# Patient Record
Sex: Female | Born: 1960 | Race: Black or African American | Hispanic: No | Smoking: Current every day smoker
Health system: Southern US, Community
[De-identification: ages and names within clinical notes are randomized; demographics above are authoritative.]

## PROBLEM LIST (undated history)

## (undated) DIAGNOSIS — G4730 Sleep apnea, unspecified: Secondary | ICD-10-CM

## (undated) DIAGNOSIS — E119 Type 2 diabetes mellitus without complications: Secondary | ICD-10-CM

## (undated) DIAGNOSIS — E669 Obesity, unspecified: Secondary | ICD-10-CM

## (undated) DIAGNOSIS — R232 Flushing: Secondary | ICD-10-CM

## (undated) DIAGNOSIS — R4589 Other symptoms and signs involving emotional state: Secondary | ICD-10-CM

## (undated) DIAGNOSIS — I10 Essential (primary) hypertension: Secondary | ICD-10-CM

## (undated) HISTORY — DX: Other symptoms and signs involving emotional state: R45.89

## (undated) HISTORY — PX: OTHER SURGICAL HISTORY: SHX169

## (undated) HISTORY — PX: ROTATOR CUFF REPAIR: SHX139

## (undated) HISTORY — PX: CHOLECYSTECTOMY: SHX55

## (undated) HISTORY — DX: Flushing: R23.2

## (undated) HISTORY — PX: CARPAL TUNNEL RELEASE: SHX101

## (undated) HISTORY — PX: ENDOMETRIAL ABLATION: SHX621

## (undated) HISTORY — DX: Sleep apnea, unspecified: G47.30

## (undated) HISTORY — PX: TUBAL LIGATION: SHX77

## (undated) HISTORY — DX: Obesity, unspecified: E66.9

---

## 2009-12-14 ENCOUNTER — Encounter: Payer: Self-pay | Admitting: Orthopedic Surgery

## 2010-01-09 ENCOUNTER — Encounter: Payer: Self-pay | Admitting: Orthopedic Surgery

## 2010-05-07 ENCOUNTER — Emergency Department (HOSPITAL_COMMUNITY)
Admission: EM | Admit: 2010-05-07 | Discharge: 2010-05-07 | Disposition: A | Discharge status: Home / Self Care | Payer: Medicaid Other | Attending: Emergency Medicine | Admitting: Emergency Medicine

## 2010-05-07 DIAGNOSIS — I10 Essential (primary) hypertension: Secondary | ICD-10-CM | POA: Insufficient documentation

## 2010-05-07 DIAGNOSIS — E119 Type 2 diabetes mellitus without complications: Secondary | ICD-10-CM | POA: Insufficient documentation

## 2010-05-07 DIAGNOSIS — M545 Low back pain, unspecified: Secondary | ICD-10-CM | POA: Insufficient documentation

## 2010-05-07 LAB — URINALYSIS, ROUTINE W REFLEX MICROSCOPIC
Bilirubin Urine: NEGATIVE
Glucose, UA: 100 mg/dL — AB
Ketones, ur: NEGATIVE mg/dL
pH: 7 (ref 5.0–8.0)

## 2011-02-24 ENCOUNTER — Other Ambulatory Visit (HOSPITAL_COMMUNITY)
Admission: RE | Admit: 2011-02-24 | Discharge: 2011-02-24 | Disposition: A | Discharge status: Home / Self Care | Payer: Medicaid Other | Source: Ambulatory Visit | Attending: Obstetrics and Gynecology | Admitting: Obstetrics and Gynecology

## 2011-02-24 DIAGNOSIS — Z01419 Encounter for gynecological examination (general) (routine) without abnormal findings: Secondary | ICD-10-CM | POA: Insufficient documentation

## 2012-02-18 ENCOUNTER — Encounter (HOSPITAL_COMMUNITY): Payer: Self-pay

## 2012-02-18 ENCOUNTER — Emergency Department (HOSPITAL_COMMUNITY)
Admission: EM | Admit: 2012-02-18 | Discharge: 2012-02-18 | Disposition: A | Discharge status: Home / Self Care | Payer: BC Managed Care – PPO | Attending: Emergency Medicine | Admitting: Emergency Medicine

## 2012-02-18 ENCOUNTER — Emergency Department (HOSPITAL_COMMUNITY): Payer: BC Managed Care – PPO

## 2012-02-18 DIAGNOSIS — Z79899 Other long term (current) drug therapy: Secondary | ICD-10-CM | POA: Insufficient documentation

## 2012-02-18 DIAGNOSIS — W010XXA Fall on same level from slipping, tripping and stumbling without subsequent striking against object, initial encounter: Secondary | ICD-10-CM | POA: Insufficient documentation

## 2012-02-18 DIAGNOSIS — S8990XA Unspecified injury of unspecified lower leg, initial encounter: Secondary | ICD-10-CM | POA: Insufficient documentation

## 2012-02-18 DIAGNOSIS — I10 Essential (primary) hypertension: Secondary | ICD-10-CM | POA: Insufficient documentation

## 2012-02-18 DIAGNOSIS — R269 Unspecified abnormalities of gait and mobility: Secondary | ICD-10-CM | POA: Insufficient documentation

## 2012-02-18 DIAGNOSIS — M549 Dorsalgia, unspecified: Secondary | ICD-10-CM | POA: Insufficient documentation

## 2012-02-18 DIAGNOSIS — Y9301 Activity, walking, marching and hiking: Secondary | ICD-10-CM | POA: Insufficient documentation

## 2012-02-18 DIAGNOSIS — S99929A Unspecified injury of unspecified foot, initial encounter: Secondary | ICD-10-CM | POA: Insufficient documentation

## 2012-02-18 DIAGNOSIS — Y929 Unspecified place or not applicable: Secondary | ICD-10-CM | POA: Insufficient documentation

## 2012-02-18 DIAGNOSIS — E119 Type 2 diabetes mellitus without complications: Secondary | ICD-10-CM | POA: Insufficient documentation

## 2012-02-18 DIAGNOSIS — S8992XA Unspecified injury of left lower leg, initial encounter: Secondary | ICD-10-CM

## 2012-02-18 HISTORY — DX: Essential (primary) hypertension: I10

## 2012-02-18 HISTORY — DX: Type 2 diabetes mellitus without complications: E11.9

## 2012-02-18 MED ORDER — IBUPROFEN 600 MG PO TABS: 600.0000 mg | ORAL_TABLET | Freq: Four times a day (QID) | ORAL | Status: DC | PRN

## 2012-02-18 MED ORDER — OXYCODONE-ACETAMINOPHEN 5-325 MG PO TABS
1.0000 | ORAL_TABLET | ORAL | Status: DC | PRN
Start: 2012-02-18 — End: 2013-10-24

## 2012-02-18 NOTE — ED Provider Notes (Signed)
History     CSN: [id number]  Arrival date & time 02/18/12  1452   First MD Initiated Contact with Patient 02/18/12 1620      Chief Complaint  Patient presents with  . Knee Pain    HPI Pt is a 52 yo F with PMH of DM and HTN presenting 5 days s/p fall on wooden ramp. She states she slipped on slick surface and hit her left knee. She has no prior history of knee pain. She had immediate pain after fall and pain has not resolved or changed. She states her pain is her entire knee and it radiates down to her ankle. She states the joint felt warm earlier but no systemic fevers. She has an abrasion to her knee which she has treated with ice and neosporin. She is able to bear weight but it is uncomfortable. No ankle or hip pain. She endorses swelling of the leg but no new numbness or tingling. She has tried Aleve which does not help much.  Past Medical History  Diagnosis Date  . Hypertension   . Diabetes mellitus without complication     Past Surgical History  Procedure Laterality Date  . Carpal tunnel release    . Ablasion      cervical  . Cholecystectomy    . Rotator cuff repair      No family history on file.  History  Substance Use Topics  . Smoking status: Never Smoker   . Smokeless tobacco: Not on file  . Alcohol Use: No    OB History   Grav Para Term Preterm Abortions TAB SAB Ect Mult Living                  Review of Systems  Constitutional: Negative for fever and chills.  HENT: Negative for neck stiffness.   Eyes: Negative for visual disturbance.  Respiratory: Negative for shortness of breath.   Cardiovascular: Positive for leg swelling. Negative for chest pain.  Gastrointestinal: Negative for abdominal pain.  Genitourinary: Negative for difficulty urinating.  Musculoskeletal: Positive for myalgias, back pain, joint swelling, arthralgias and gait problem.  Skin: Positive for wound. Negative for rash.  All other systems reviewed and are negative.   Allergies   Codeine  Home Medications   Current Outpatient Rx  Name  Route  Sig  Dispense  Refill  . amLODipine (NORVASC) 10 MG tablet   Oral   Take 10 mg by mouth daily.         . insulin glargine (LANTUS SOLOSTAR) 100 UNIT/ML injection   Subcutaneous   Inject 71 Units into the skin at bedtime.         Marland Kitchen lisinopril (PRINIVIL,ZESTRIL) 40 MG tablet   Oral   Take 40 mg by mouth daily.         Marland Kitchen losartan (COZAAR) 100 MG tablet   Oral   Take 100 mg by mouth daily.         Marland Kitchen ibuprofen (ADVIL,MOTRIN) 600 MG tablet   Oral   Take 1 tablet (600 mg total) by mouth every 6 (six) hours as needed for pain.   30 tablet   0   . oxyCODONE-acetaminophen (PERCOCET/ROXICET) 5-325 MG per tablet   Oral   Take 1 tablet by mouth every 4 (four) hours as needed for pain.   10 tablet   0     BP 140/70  Pulse 82  Temp(Src) 97.8 F (36.6 C) (Oral)  Resp 18  Ht 5\' 5"  (1.651  m)  Wt 260 lb (117.935 kg)  BMI 43.27 kg/m2  SpO2 100%  Physical Exam  Constitutional: She is oriented to person, place, and time. She appears well-developed and well-nourished. No distress.  HENT:  Head: Normocephalic and atraumatic.  Neck: Normal range of motion.  Cardiovascular: Normal rate and regular rhythm.   No murmur heard. Pulmonary/Chest: Effort normal and breath sounds normal.  Abdominal: Soft. There is no tenderness.  Musculoskeletal:       Left knee: She exhibits decreased range of motion (Able to bend to 90 degrees), swelling, laceration (Well healing abrasion to patella without significant erythema) and bony tenderness. She exhibits no effusion. Tenderness (Diffuse tenderness, most prominent medial joint line and posterior knee) found.  Left lower leg with obvious edema compared to right. No calf pain, no redness.  Lymphadenopathy:    She has no cervical adenopathy.  Neurological: She is alert and oriented to person, place, and time. No cranial nerve deficit.  Skin: Skin is warm and dry. No rash noted.     ED Course  Procedures (including critical care time)  Labs Reviewed - No data to display Dg Knee Complete 4 Views Left  02/18/2012  *RADIOLOGY REPORT*  Clinical Data: Left knee pain, fell 5 days ago  LEFT KNEE - COMPLETE 4+ VIEW  Comparison: None.  Findings: The left knee joint spaces appear normal.  No fracture is seen.  No effusion is noted.  IMPRESSION: Negative.   Original Report Authenticated By: Dwyane Dee, M.D.    1. Left knee injury     MDM  52 yo F with left knee injury after fall  Patient seen and examined. Xrays reviewed. Given the swelling of her lower leg, she likely has some intraarticular injury. No risk factors for DVT (no immobilization, estrogen use, surgery, hypercoagulable state, calf tenderness or redness.) Will place in immobilizer and crutches to take weight off of knee. Given Percocet #10 and Ibuprofen 600mg . She will have an MRI scheduled as an outpatient for further joint evaluation. She will follow up with ortho after MRI. Patient agrees with this plan.      Hilarie Fredrickson, MD 02/18/12 [phone]

## 2012-02-18 NOTE — ED Provider Notes (Signed)
I saw and evaluated the patient, reviewed the resident's note and I agree with the findings and plan.   .Face to face Exam:  General:  Awake HEENT:  Atraumatic Resp:  Normal effort Abd:  Nondistended Neuro:No focal weakness Lymph: No adenopathy   Nelia Shi, MD 02/18/12 1736

## 2012-02-18 NOTE — ED Notes (Signed)
Pt reports slipping and falling last Tuesday, she injured left knee.

## 2012-05-16 ENCOUNTER — Ambulatory Visit (INDEPENDENT_AMBULATORY_CARE_PROVIDER_SITE_OTHER): Payer: BC Managed Care – PPO | Admitting: Orthopedic Surgery

## 2012-05-16 VITALS — BP 150/78 | Ht 65.0 in | Wt 268.0 lb

## 2012-05-16 DIAGNOSIS — IMO0002 Reserved for concepts with insufficient information to code with codable children: Secondary | ICD-10-CM

## 2012-05-16 DIAGNOSIS — M7050 Other bursitis of knee, unspecified knee: Secondary | ICD-10-CM

## 2012-05-16 DIAGNOSIS — M65831 Other synovitis and tenosynovitis, right forearm: Secondary | ICD-10-CM

## 2012-05-16 DIAGNOSIS — M65839 Other synovitis and tenosynovitis, unspecified forearm: Secondary | ICD-10-CM

## 2012-05-16 NOTE — Patient Instructions (Addendum)
Tendonitis and bursitis   Tendinitis Tendinitis is swelling and inflammation of the tendons. Tendons are band-like tissues that connect muscle to bone. Tendinitis commonly occurs in the:   Shoulders (rotator cuff).  Heels (Achilles tendon).  Elbows (triceps tendon). CAUSES Tendinitis is usually caused by overusing the tendon, muscles, and joints involved. When the tissue surrounding a tendon (synovium) becomes inflamed, it is called tenosynovitis. Tendinitis commonly develops in people whose jobs require repetitive motions. SYMPTOMS  Pain.  Tenderness.  Mild swelling. DIAGNOSIS Tendinitis is usually diagnosed by physical exam. Your caregiver may also order X-rays or other imaging tests. TREATMENT Your caregiver may recommend certain medicines or exercises for your treatment. HOME CARE INSTRUCTIONS   Use a sling or splint for as long as directed by your caregiver until the pain decreases.  Put ice on the injured area.  Put ice in a plastic bag.  Place a towel between your skin and the bag.  Leave the ice on for 15 to 20 minutes, 3 to 4 times a day.  Avoid using the limb while the tendon is painful. Perform gentle range of motion exercises only as directed by your caregiver. Stop exercises if pain or discomfort increase, unless directed otherwise by your caregiver.  Only take over-the-counter or prescription medicines for pain, discomfort, or fever as directed by your caregiver. SEEK MEDICAL CARE IF:   Your pain and swelling increase.  You develop new, unexplained symptoms, especially increased numbness in the hands. MAKE SURE YOU:   Understand these instructions.  Will watch your condition.  Will get help right away if you are not doing well or get worse. Document Released: 12/24/1999 Document Revised: 03/20/2011 Document Reviewed: 03/14/2010 [facility] Patient Information 2013 [place], [place]. Bursitis Bursitis is a swelling and soreness (inflammation) of a  fluid-filled sac (bursa) that overlies and protects a joint. It can be caused by injury, overuse of the joint, arthritis or infection. The joints most likely to be affected are the elbows, shoulders, hips and knees. HOME CARE INSTRUCTIONS   Apply ice to the affected area for 15 to 20 minutes each hour while awake for 2 days. Put the ice in a plastic bag and place a towel between the bag of ice and your skin.  Rest the injured joint as much as possible, but continue to put the joint through a full range of motion, 4 times per day. (The shoulder joint especially becomes rapidly "frozen" if not used.) When the pain lessens, begin normal slow movements and usual activities.  Only take over-the-counter or prescription medicines for pain, discomfort or fever as directed by your caregiver.  Your caregiver may recommend draining the bursa and injecting medicine into the bursa. This may help the healing process.  Follow all instructions for follow-up with your caregiver. This includes any orthopedic referrals, physical therapy and rehabilitation. Any delay in obtaining necessary care could result in a delay or failure of the bursitis to heal and chronic pain. SEEK IMMEDIATE MEDICAL CARE IF:   Your pain increases even during treatment.  You develop an oral temperature above 102 F (38.9 C) and have heat and inflammation over the involved bursa. MAKE SURE YOU:   Understand these instructions.  Will watch your condition.  Will get help right away if you are not doing well or get worse. Document Released: 12/24/1999 Document Revised: 03/20/2011 Document Reviewed: 11/27/2008 [facility] Patient Information 2013 [place], [place].   Wear splint x 4 weeks   Apply ice daily x 4 weeks   Continue  aleve

## 2012-05-16 NOTE — Progress Notes (Signed)
Patient ID: Bonnie Keith, female   DOB: 16-Sep-1960, 52 y.o.   MRN: [id number] Chief complaint #1 left knee pain  Chief complaint #2 right wrist pain  The patient started having right wrist pain about 3 days ago no trauma she does play a lot of video games. Complains of dorsal aching nonradiating wrist pain over the wrist joint associated with wrist extension  She injured the left knee with a fall about 3 months ago she did go to the ER x-rays were negative she complains of medial knee pain sharp throbbing 10 out of 10 constant morning and night sleeps with a pillow in between to her legs worse with standing and sitting associated with catching and swelling over the medial hamstring/pes tendons  14 systems reviewed negative except for seasonal allergies and musculoskeletal complaints  Allergies none  MEDICAL HISTORY:  Diabetes Hypertension  Surgeries:  History of carpal tunnel syndrome left and right surgery Rotator cuff tear repair right shoulder Cholecystectomy Tubal ligation  Family history arthritis, lung disease, cancer, diabetes  Social 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 Felecia Shelling  [facility] [place]  Vital signs BP 150/78  Ht 5\' 5"  (1.651 m)  Wt 268 lb (121.564 kg)  BMI 44.6 kg/m2   General appearance the patient is well-developed and well-nourished grooming and hygiene are normal body habitus large  The patient is alert and oriented x3 her mood and affect are normal  Ambulatory status normal gait   LUE: normal alignment, ROM stability and strength. RUE: shoulder normal elbow normal , tenderness and swelling right 2/3 extensor compartment with normal but pain ROM in wrist extension, watson test normal, wrist stability confirmed, alignment normal. No atrophy   Extremity right knee normal alignment, ROM, stability and Motor 5/5; skin normal  Left knee  Inspection tender over the medial pes  tendons Range of motion remains normal  The Lachman test is normal the anterior and posterior drawer tests are normal and the collateral ligaments are stable Motor exam 5/5 Skin normal  McMurray's sign negative    Cardiovascular exam normal pulse and perfusion without edema tenderness or varicose veins  Sensory exam is normal  Xrays left knee normal   DX 1 Pes bursitis 2 Intesection syndrome   Plan: Inject left knee bursa          Splint right wrist x 4 weeks   Knee  Injection Procedure Note  Pre-operative Diagnosis: left knee bursitis  Post-operative Diagnosis: same  Indications: pain  Anesthesia: ethyl chloride   Procedure Details   Verbal consent was obtained for the procedure. Time out was completed.The burs  was prepped with alcohol, followed by  Ethyl chloride spray and A 25 gauge needle was inserted into the pes bursa via lateral approach; 4ml 1% lidocaine and 1 ml of depomedrol  was then injected into the joint . The needle was removed and the area cleansed and dressed.  Complications:  None; patient tolerated the procedure well.

## 2012-05-17 ENCOUNTER — Encounter: Payer: Self-pay | Admitting: Orthopedic Surgery

## 2012-05-17 DIAGNOSIS — M65839 Other synovitis and tenosynovitis, unspecified forearm: Secondary | ICD-10-CM | POA: Insufficient documentation

## 2012-05-17 DIAGNOSIS — M7050 Other bursitis of knee, unspecified knee: Secondary | ICD-10-CM | POA: Insufficient documentation

## 2012-07-03 ENCOUNTER — Other Ambulatory Visit: Payer: Self-pay | Admitting: Adult Health

## 2012-07-03 DIAGNOSIS — Z139 Encounter for screening, unspecified: Secondary | ICD-10-CM

## 2012-07-26 ENCOUNTER — Ambulatory Visit (HOSPITAL_COMMUNITY)
Admission: RE | Admit: 2012-07-26 | Discharge: 2012-07-26 | Disposition: A | Discharge status: Home / Self Care | Payer: BC Managed Care – PPO | Source: Ambulatory Visit | Attending: Adult Health | Admitting: Adult Health

## 2012-07-26 DIAGNOSIS — Z139 Encounter for screening, unspecified: Secondary | ICD-10-CM

## 2012-07-26 DIAGNOSIS — Z1231 Encounter for screening mammogram for malignant neoplasm of breast: Secondary | ICD-10-CM | POA: Insufficient documentation

## 2012-09-04 ENCOUNTER — Other Ambulatory Visit: Payer: Self-pay | Admitting: Adult Health

## 2012-10-23 ENCOUNTER — Other Ambulatory Visit: Payer: Self-pay | Admitting: Adult Health

## 2013-09-17 ENCOUNTER — Other Ambulatory Visit: Payer: Self-pay | Admitting: Adult Health

## 2013-09-17 DIAGNOSIS — Z1231 Encounter for screening mammogram for malignant neoplasm of breast: Secondary | ICD-10-CM

## 2013-10-10 ENCOUNTER — Ambulatory Visit (HOSPITAL_COMMUNITY): Payer: BC Managed Care – PPO

## 2013-10-20 ENCOUNTER — Ambulatory Visit (HOSPITAL_COMMUNITY)
Admission: RE | Admit: 2013-10-20 | Discharge: 2013-10-20 | Disposition: A | Discharge status: Home / Self Care | Payer: PRIVATE HEALTH INSURANCE | Source: Ambulatory Visit | Attending: Adult Health | Admitting: Adult Health

## 2013-10-20 DIAGNOSIS — Z1231 Encounter for screening mammogram for malignant neoplasm of breast: Secondary | ICD-10-CM | POA: Diagnosis not present

## 2013-10-24 ENCOUNTER — Encounter: Payer: Self-pay | Admitting: Adult Health

## 2013-10-24 ENCOUNTER — Other Ambulatory Visit (HOSPITAL_COMMUNITY)
Admission: RE | Admit: 2013-10-24 | Discharge: 2013-10-24 | Disposition: A | Discharge status: Home / Self Care | Payer: PRIVATE HEALTH INSURANCE | Source: Ambulatory Visit | Attending: Adult Health | Admitting: Adult Health

## 2013-10-24 ENCOUNTER — Ambulatory Visit (INDEPENDENT_AMBULATORY_CARE_PROVIDER_SITE_OTHER): Payer: PRIVATE HEALTH INSURANCE | Admitting: Adult Health

## 2013-10-24 VITALS — BP 132/76 | HR 80 | Ht 65.0 in | Wt 262.0 lb

## 2013-10-24 DIAGNOSIS — R232 Flushing: Secondary | ICD-10-CM | POA: Insufficient documentation

## 2013-10-24 DIAGNOSIS — Z01419 Encounter for gynecological examination (general) (routine) without abnormal findings: Secondary | ICD-10-CM

## 2013-10-24 DIAGNOSIS — Z780 Asymptomatic menopausal state: Secondary | ICD-10-CM

## 2013-10-24 DIAGNOSIS — R4589 Other symptoms and signs involving emotional state: Secondary | ICD-10-CM | POA: Insufficient documentation

## 2013-10-24 DIAGNOSIS — Z1151 Encounter for screening for human papillomavirus (HPV): Secondary | ICD-10-CM | POA: Insufficient documentation

## 2013-10-24 DIAGNOSIS — Z1212 Encounter for screening for malignant neoplasm of rectum: Secondary | ICD-10-CM

## 2013-10-24 HISTORY — DX: Other symptoms and signs involving emotional state: R45.89

## 2013-10-24 HISTORY — DX: Flushing: R23.2

## 2013-10-24 LAB — HEMOCCULT GUIAC POC 1CARD (OFFICE): FECAL OCCULT BLD: NEGATIVE

## 2013-10-24 MED ORDER — VENLAFAXINE HCL ER 37.5 MG PO CP24: 37.5000 mg | ORAL_CAPSULE | Freq: Every day | ORAL | Status: DC

## 2013-10-24 MED ORDER — VENLAFAXINE HCL ER 75 MG PO CP24: 75.0000 mg | ORAL_CAPSULE | Freq: Every day | ORAL | Status: DC

## 2013-10-24 MED ORDER — ALPRAZOLAM 0.5 MG PO TABS: 0.5000 mg | ORAL_TABLET | Freq: Two times a day (BID) | ORAL | Status: DC | PRN

## 2013-10-24 NOTE — Progress Notes (Signed)
Patient ID: Bonnie Keith, female   DOB: 01/09/1961, 53 y.o.   MRN: [id number] History of Present Illness: Bonnie Keith is a [age] year old black female in for a pap and physical.She complains of being moody ,stressed at work, hot flashes and not sleeping well.   Current Medications, Allergies, Past Medical History, Past Surgical History, Family History and Social History were reviewed in [facility] electronic medical record.     Review of Systems: Patient denies any headaches, blurred vision, shortness of breath, chest pain, abdominal pain, problems with bowel movements, urination, or intercourse. Not having sex, has pain in shoulder, she is tense, see HPI.She is diabetic and has hypertension and is off diabetes meds, has appt to see [facility] today. Got flu shot at work.  Physical Exam:BP 132/76  Pulse 80  Ht 5\' 5"  (1.651 m)  Wt 262 lb (118.842 kg)  BMI 43.60 kg/m2 General:  Well developed, well nourished, no acute distress Skin:  Warm and dry Neck:  Midline trachea, normal thyroid Lungs; Clear to auscultation bilaterally Breast:  No dominant palpable mass, retraction, or nipple discharge, has small tear under left breast,use neosporin. Cardiovascular: Regular rate and rhythm Abdomen:  Soft, non tender, no hepatosplenomegaly,obese Pelvic:  External genitalia is normal in appearance.  The vagina is normal in appearance.  The cervix is smooth and stenotic at os, pap with HPV performed.Marland Kitchen.  Uterus is felt to be normal size, shape, and contour.  No  adnexal masses or tenderness noted.Diffficult secondary to abdominal girth. Rectal: Good sphincter tone, no polyps,internal  hemorrhoids felt.  Hemoccult negative. Extremities:  No swelling or varicosities noted Psych: Alert and cooperative,seems happy but stressed Discussed trying med for moods and hot flashes and will try Effexor.  Impression: Well woman yearly exam with pap Hot flashes Moody  Menopause     Plan:  Physical in  1 year Mammogram yearly  Rx Effexor XR 37.5 mg #7 1 daily x 7 days then increase to 75 mg and rx given with 11 refills  Rx xanax 0.5 mg #30 1 bid #30 with 1 refill Follow up in 4 weeks Review handout on menopause

## 2013-10-24 NOTE — Patient Instructions (Addendum)
Menopause Menopause is the normal time of life when menstrual periods stop completely. Menopause is complete when you have missed 12 consecutive menstrual periods. It usually occurs between the ages of 48 years and 55 years. Very rarely does a woman develop menopause before the age of 40 years. At menopause, your ovaries stop producing the female hormones estrogen and progesterone. This can cause undesirable symptoms and also affect your health. Sometimes the symptoms may occur 4-5 years before the menopause begins. There is no relationship between menopause and:  Oral contraceptives.  Number of children you had.  Race.  The age your menstrual periods started (menarche). Heavy smokers and very thin women may develop menopause earlier in life. CAUSES  The ovaries stop producing the female hormones estrogen and progesterone.  Other causes include:  Surgery to remove both ovaries.  The ovaries stop functioning for no known reason.  Tumors of the pituitary gland in the brain.  Medical disease that affects the ovaries and hormone production.  Radiation treatment to the abdomen or pelvis.  Chemotherapy that affects the ovaries. SYMPTOMS   Hot flashes.  Night sweats.  Decrease in sex drive.  Vaginal dryness and thinning of the vagina causing painful intercourse.  Dryness of the skin and developing wrinkles.  Headaches.  Tiredness.  Irritability.  Memory problems.  Weight gain.  Bladder infections.  Hair growth of the face and chest.  Infertility. More serious symptoms include:  Loss of bone (osteoporosis) causing breaks (fractures).  Depression.  Hardening and narrowing of the arteries (atherosclerosis) causing heart attacks and strokes. DIAGNOSIS   When the menstrual periods have stopped for 12 straight months.  Physical exam.  Hormone studies of the blood. TREATMENT  There are many treatment choices and nearly as many questions about them. The  decisions to treat or not to treat menopausal changes is an individual choice made with your health care provider. Your health care provider can discuss the treatments with you. Together, you can decide which treatment will work best for you. Your treatment choices may include:   Hormone therapy (estrogen and progesterone).  Non-hormonal medicines.  Treating the individual symptoms with medicine (for example antidepressants for depression).  Herbal medicines that may help specific symptoms.  Counseling by a psychiatrist or psychologist.  Group therapy.  Lifestyle changes including:  Eating healthy.  Regular exercise.  Limiting caffeine and alcohol.  Stress management and meditation.  No treatment. HOME CARE INSTRUCTIONS   Take the medicine your health care provider gives you as directed.  Get plenty of sleep and rest.  Exercise regularly.  Eat a diet that contains calcium (good for the bones) and soy products (acts like estrogen hormone).  Avoid alcoholic beverages.  Do not smoke.  If you have hot flashes, dress in layers.  Take supplements, calcium, and vitamin D to strengthen bones.  You can use over-the-counter lubricants or moisturizers for vaginal dryness.  Group therapy is sometimes very helpful.  Acupuncture may be helpful in some cases. SEEK MEDICAL CARE IF:   You are not sure you are in menopause.  You are having menopausal symptoms and need advice and treatment.  You are still having menstrual periods after age 55 years.  You have pain with intercourse.  Menopause is complete (no menstrual period for 12 months) and you develop vaginal bleeding.  You need a referral to a specialist (gynecologist, psychiatrist, or psychologist) for treatment. SEEK IMMEDIATE MEDICAL CARE IF:   You have severe depression.  You have excessive vaginal bleeding.    You fell and think you have a broken bone.  You have pain when you urinate.  You develop leg or  chest pain.  You have a fast pounding heart beat (palpitations).  You have severe headaches.  You develop vision problems.  You feel a lump in your breast.  You have abdominal pain or severe indigestion. Document Released: 03/18/2003 Document Revised: 08/28/2012 Document Reviewed: 07/25/2012 [facility] Patient Information 2015 [place]. This information is not intended to replace advice given to you by your health care provider. Make sure you discuss any questions you have with your health care provider. Take effexor  follow u pin 4 weeks

## 2013-10-28 LAB — CYTOLOGY - PAP

## 2013-11-10 ENCOUNTER — Encounter: Payer: Self-pay | Admitting: Adult Health

## 2013-11-21 ENCOUNTER — Ambulatory Visit (INDEPENDENT_AMBULATORY_CARE_PROVIDER_SITE_OTHER): Payer: PRIVATE HEALTH INSURANCE | Admitting: Adult Health

## 2013-11-21 ENCOUNTER — Encounter: Payer: Self-pay | Admitting: Adult Health

## 2013-11-21 VITALS — BP 136/70 | Ht 65.0 in | Wt 259.0 lb

## 2013-11-21 DIAGNOSIS — R232 Flushing: Secondary | ICD-10-CM

## 2013-11-21 DIAGNOSIS — R4589 Other symptoms and signs involving emotional state: Secondary | ICD-10-CM

## 2013-11-21 DIAGNOSIS — F489 Nonpsychotic mental disorder, unspecified: Secondary | ICD-10-CM

## 2013-11-21 DIAGNOSIS — N951 Menopausal and female climacteric states: Secondary | ICD-10-CM

## 2013-11-21 MED ORDER — ALPRAZOLAM 0.5 MG PO TABS: 0.5000 mg | ORAL_TABLET | Freq: Two times a day (BID) | ORAL | Status: DC | PRN

## 2013-11-21 MED ORDER — VENLAFAXINE HCL ER 150 MG PO CP24: 150.0000 mg | ORAL_CAPSULE | Freq: Every day | ORAL | Status: DC

## 2013-11-21 NOTE — Patient Instructions (Signed)
Will increase effexor to 150 mg and follow up in 5 weeks

## 2013-11-21 NOTE — Progress Notes (Signed)
Subjective:     Patient ID: Bonnie Keith, female   DOB: 08/24/1960, 53 y.o.   MRN: [id number]  HPI Bonnie Keith is a 53 year old black female back in follow up of starting Effexor.  Review of Systems See HPI Reviewed past medical,surgical, social and family history. Reviewed medications and allergies.     Objective:   Physical Exam BP 136/70 mmHg  Ht 5\' 5"  (1.651 m)  Wt 259 lb (117.482 kg)  BMI 43.10 kg/m2   She says hot flashes better, but still moody and stressed at work and not sleeping well unless takes a xanax. Discussed increasing Effexor and she agrees.She says she used to take Paxil and another med but she weaned herself off.She says her DON says she needs to take a happy pill.  Assessment:     Moody Hot flashes    Plan:    Refilled xanax 0.5 mg #30 1 bid prn with 1 refill Rx Effexor XR 150 mg #30 take 1 daily with 3 refills(OK to take 2 75 mg tabs,has at home) Follow up in 5 weeks

## 2013-12-26 ENCOUNTER — Ambulatory Visit: Payer: PRIVATE HEALTH INSURANCE | Admitting: Adult Health

## 2014-01-17 ENCOUNTER — Emergency Department (HOSPITAL_COMMUNITY)
Admission: EM | Admit: 2014-01-17 | Discharge: 2014-01-17 | Disposition: A | Discharge status: Home / Self Care | Payer: PRIVATE HEALTH INSURANCE | Attending: Emergency Medicine | Admitting: Emergency Medicine

## 2014-01-17 ENCOUNTER — Encounter (HOSPITAL_COMMUNITY): Payer: Self-pay | Admitting: Emergency Medicine

## 2014-01-17 DIAGNOSIS — E119 Type 2 diabetes mellitus without complications: Secondary | ICD-10-CM | POA: Insufficient documentation

## 2014-01-17 DIAGNOSIS — X58XXXA Exposure to other specified factors, initial encounter: Secondary | ICD-10-CM | POA: Insufficient documentation

## 2014-01-17 DIAGNOSIS — Y9289 Other specified places as the place of occurrence of the external cause: Secondary | ICD-10-CM | POA: Insufficient documentation

## 2014-01-17 DIAGNOSIS — Y9389 Activity, other specified: Secondary | ICD-10-CM | POA: Insufficient documentation

## 2014-01-17 DIAGNOSIS — Y998 Other external cause status: Secondary | ICD-10-CM | POA: Insufficient documentation

## 2014-01-17 DIAGNOSIS — Z8742 Personal history of other diseases of the female genital tract: Secondary | ICD-10-CM | POA: Insufficient documentation

## 2014-01-17 DIAGNOSIS — Z792 Long term (current) use of antibiotics: Secondary | ICD-10-CM | POA: Insufficient documentation

## 2014-01-17 DIAGNOSIS — E669 Obesity, unspecified: Secondary | ICD-10-CM | POA: Insufficient documentation

## 2014-01-17 DIAGNOSIS — S233XXA Sprain of ligaments of thoracic spine, initial encounter: Secondary | ICD-10-CM

## 2014-01-17 DIAGNOSIS — S29012A Strain of muscle and tendon of back wall of thorax, initial encounter: Secondary | ICD-10-CM | POA: Insufficient documentation

## 2014-01-17 DIAGNOSIS — Z87891 Personal history of nicotine dependence: Secondary | ICD-10-CM | POA: Insufficient documentation

## 2014-01-17 DIAGNOSIS — Z8659 Personal history of other mental and behavioral disorders: Secondary | ICD-10-CM | POA: Insufficient documentation

## 2014-01-17 DIAGNOSIS — Z79899 Other long term (current) drug therapy: Secondary | ICD-10-CM | POA: Insufficient documentation

## 2014-01-17 DIAGNOSIS — I10 Essential (primary) hypertension: Secondary | ICD-10-CM | POA: Insufficient documentation

## 2014-01-17 MED ORDER — METHOCARBAMOL 500 MG PO TABS: 500.0000 mg | ORAL_TABLET | Freq: Two times a day (BID) | ORAL | Status: DC

## 2014-01-17 NOTE — Discharge Instructions (Signed)
Back Pain, Adult °Back pain is very common. The pain often gets better over time. The cause of back pain is usually not dangerous. Most people can learn to manage their back pain on their own.  °HOME CARE  °· Stay active. Start with short walks on flat ground if you can. Try to walk farther each day. °· Do not sit, drive, or stand in one place for more than 30 minutes. Do not stay in bed. °· Do not avoid exercise or work. Activity can help your back heal faster. °· Be careful when you bend or lift an object. Bend at your knees, keep the object close to you, and do not twist. °· Sleep on a firm mattress. Lie on your side, and bend your knees. If you lie on your back, put a pillow under your knees. °· Only take medicines as told by your doctor. °· Put ice on the injured area. °¨ Put ice in a plastic bag. °¨ Place a towel between your skin and the bag. °¨ Leave the ice on for 15-20 minutes, 03-04 times a day for the first 2 to 3 days. After that, you can switch between ice and heat packs. °· Ask your doctor about back exercises or massage. °· Avoid feeling anxious or stressed. Find good ways to deal with stress, such as exercise. °GET HELP RIGHT AWAY IF:  °· Your pain does not go away with rest or medicine. °· Your pain does not go away in 1 week. °· You have new problems. °· You do not feel well. °· The pain spreads into your legs. °· You cannot control when you poop (bowel movement) or pee (urinate). °· Your arms or legs feel weak or lose feeling (numbness). °· You feel sick to your stomach (nauseous) or throw up (vomit). °· You have belly (abdominal) pain. °· You feel like you may pass out (faint). °MAKE SURE YOU:  °· Understand these instructions. °· Will watch your condition. °· Will get help right away if you are not doing well or get worse. °Document Released: 06/14/2007 Document Revised: 03/20/2011 Document Reviewed: 04/29/2013 °[employer]® Patient Information ©2015 [employer]. This information is not intended  to replace advice given to you by your health care provider. Make sure you discuss any questions you have with your health care provider. ° °

## 2014-01-17 NOTE — ED Provider Notes (Signed)
CSN: [id number]     Arrival date & time 01/17/14  1238 History   First MD Initiated Contact with Patient 01/17/14 1331     Chief Complaint  Patient presents with  . Back Pain     (Consider location/radiation/quality/duration/timing/severity/associated sxs/prior Treatment) Patient is a 54 y.o. female presenting with back pain. The history is provided by the patient.  Back Pain Location:  Thoracic spine Radiates to:  Does not radiate Pain severity:  Moderate Onset quality:  Gradual Duration:  3 days Timing:  Constant Progression:  Worsening Relieved by:  Nothing Worsened by:  Movement, bending and ambulation Associated symptoms: no bladder incontinence and no bowel incontinence    Bonnie Keith is a 54 y.o. female who presents to the ED with low back pain that started 3 days ago. She does not know of any injury to her back. She denies loss of control of bladder or bowels.  Past Medical History  Diagnosis Date  . Hypertension   . Diabetes mellitus without complication   . Obesity   . Hot flashes 10/24/2013  . Moody 10/24/2013   Past Surgical History  Procedure Laterality Date  . Carpal tunnel release    . Ablasion      cervical  . Cholecystectomy    . Rotator cuff repair    . Tubal ligation     Family History  Problem Relation Age of Onset  . Cancer Mother     lung  . Diabetes Mother   . COPD Mother   . Stroke Father   . Hypertension Sister   . Irritable bowel syndrome Sister   . Hypertension Maternal Grandmother   . Diabetes Maternal Grandmother   . Hypertension Maternal Grandfather   . Diabetes Maternal Grandfather   . Cancer Maternal Grandfather     black lung disease  . Stroke Paternal Grandfather   . Irritable bowel syndrome Sister    History  Substance Use Topics  . Smoking status: Former Smoker    Types: Cigarettes  . Smokeless tobacco: Never Used  . Alcohol Use: No   OB History    Gravida Para Term Preterm AB TAB SAB Ectopic Multiple Living   2  1   1  1   1      Review of Systems  Gastrointestinal: Negative for bowel incontinence.  Genitourinary: Negative for bladder incontinence.  Musculoskeletal: Positive for back pain.  all other systems negative    Allergies  Codeine and Lisinopril  Home Medications   Prior to Admission medications   Medication Sig Start Date End Date Taking? Authorizing Provider  amLODipine (NORVASC) 10 MG tablet Take 5 mg by mouth daily.    Yes Historical Provider, MD  empagliflozin (JARDIANCE) 25 MG TABS tablet Take 25 mg by mouth daily.   Yes Historical Provider, MD  glipiZIDE (GLUCOTROL) 5 MG tablet Take 5 mg by mouth daily.   Yes Historical Provider, MD  losartan (COZAAR) 100 MG tablet Take 100 mg by mouth daily.   Yes Historical Provider, MD  metroNIDAZOLE (METROCREAM) 0.75 % cream Apply 1 application topically 2 (two) times daily.   Yes Historical Provider, MD  ALPRAZolam Prudy Feeler(XANAX) 0.5 MG tablet Take 1 tablet (0.5 mg total) by mouth 2 (two) times daily as needed for anxiety. Patient not taking: Reported on 01/17/2014 11/21/13   Adline PotterJennifer A Griffin, NP  ibuprofen (ADVIL,MOTRIN) 600 MG tablet Take 1 tablet (600 mg total) by mouth every 6 (six) hours as needed for pain. Patient not taking: Reported on  01/17/2014 02/18/12   Amber Nydia Bouton, MD  methocarbamol (ROBAXIN) 500 MG tablet Take 1 tablet (500 mg total) by mouth 2 (two) times daily. 01/17/14   Tanice Petre Orlene Och, NP  venlafaxine XR (EFFEXOR-XR) 150 MG 24 hr capsule Take 1 capsule (150 mg total) by mouth daily with breakfast. Patient not taking: Reported on 01/17/2014 11/21/13   Adline Potter, NP   BP 154/63 mmHg  Pulse 71  Temp(Src) 97.5 F (36.4 C) (Core (Comment))  Resp 18  SpO2 100% Physical Exam  Constitutional: She is oriented to person, place, and time. She appears well-developed and well-nourished. No distress.  HENT:  Head: Normocephalic and atraumatic.  Right Ear: Tympanic membrane normal.  Left Ear: Tympanic membrane normal.  Nose: Nose  normal.  Mouth/Throat: Uvula is midline, oropharynx is clear and moist and mucous membranes are normal.  Eyes: EOM are normal.  Neck: Normal range of motion. Neck supple.  Cardiovascular: Normal rate and regular rhythm.   Pulmonary/Chest: Effort normal. She has no wheezes. She has no rales.  Abdominal: Soft. Bowel sounds are normal. There is no tenderness.  Musculoskeletal:       Thoracic back: She exhibits tenderness and spasm. She exhibits normal range of motion and normal pulse.       Back:  Neurological: She is alert and oriented to person, place, and time. She has normal strength. No cranial nerve deficit or sensory deficit. Gait normal.  Reflex Scores:      Bicep reflexes are 2+ on the right side and 2+ on the left side.      Brachioradialis reflexes are 2+ on the right side and 2+ on the left side.      Patellar reflexes are 2+ on the right side and 2+ on the left side.      Achilles reflexes are 2+ on the right side and 2+ on the left side. Skin: Skin is warm and dry.  Psychiatric: She has a normal mood and affect. Her behavior is normal.  Nursing note and vitals reviewed.   ED Course  Procedures (including critical care time) Labs Review  MDM  54 y.o. female with thoracic pain x 3 days. Stable for discharge without neuro deficits. Will treat with muscle relaxants and she will take ibuprofen. She will use heat but only on low so to prevent burns. Discussed with the patient and all questioned fully answered. She will return if any problems arise.   Final diagnoses:  Thoracic sprain and strain, initial encounter     [facility], NP 01/18/14 1621  Hilario Quarry, MD 01/21/14 [phone]

## 2014-01-17 NOTE — ED Notes (Signed)
PT c/o lower back pain x3 days with no reported injury. PT denies any urinary symptoms. PT ambulatory in triage with NAD noted.

## 2014-02-26 LAB — HEMOGLOBIN A1C: Hemoglobin A1C: 9.5

## 2014-09-28 ENCOUNTER — Ambulatory Visit: Payer: PRIVATE HEALTH INSURANCE | Admitting: Adult Health

## 2014-11-24 ENCOUNTER — Other Ambulatory Visit: Payer: Self-pay | Admitting: Adult Health

## 2014-11-24 DIAGNOSIS — Z1231 Encounter for screening mammogram for malignant neoplasm of breast: Secondary | ICD-10-CM

## 2014-12-07 ENCOUNTER — Ambulatory Visit (HOSPITAL_COMMUNITY): Payer: PRIVATE HEALTH INSURANCE

## 2015-01-08 ENCOUNTER — Ambulatory Visit (HOSPITAL_COMMUNITY): Payer: PRIVATE HEALTH INSURANCE

## 2015-01-13 ENCOUNTER — Other Ambulatory Visit: Payer: Self-pay | Admitting: Adult Health

## 2015-01-15 ENCOUNTER — Ambulatory Visit (HOSPITAL_COMMUNITY)
Admission: RE | Admit: 2015-01-15 | Discharge: 2015-01-15 | Disposition: A | Discharge status: Home / Self Care | Payer: 59 | Source: Ambulatory Visit | Attending: Adult Health | Admitting: Adult Health

## 2015-01-15 DIAGNOSIS — Z1231 Encounter for screening mammogram for malignant neoplasm of breast: Secondary | ICD-10-CM

## 2015-01-15 LAB — HEMOGLOBIN A1C: HEMOGLOBIN A1C: 8.9

## 2015-03-05 ENCOUNTER — Other Ambulatory Visit: Payer: Self-pay | Admitting: Adult Health

## 2015-03-05 LAB — LIPID PANEL
Cholesterol: 113 mg/dL (ref 0–200)
HDL: 43 mg/dL (ref 35–70)
LDL CALC: 47 mg/dL
TRIGLYCERIDES: 115 mg/dL (ref 40–160)

## 2015-03-12 ENCOUNTER — Other Ambulatory Visit: Payer: Self-pay | Admitting: Adult Health

## 2015-03-17 ENCOUNTER — Ambulatory Visit: Payer: PRIVATE HEALTH INSURANCE | Admitting: Adult Health

## 2015-04-09 ENCOUNTER — Ambulatory Visit: Payer: PRIVATE HEALTH INSURANCE | Admitting: Adult Health

## 2015-04-09 ENCOUNTER — Other Ambulatory Visit: Payer: Self-pay | Admitting: Adult Health

## 2015-06-18 LAB — HEMOGLOBIN A1C: HEMOGLOBIN A1C: 11.7

## 2015-06-22 ENCOUNTER — Ambulatory Visit (INDEPENDENT_AMBULATORY_CARE_PROVIDER_SITE_OTHER): Payer: 59 | Admitting: Pulmonary Disease

## 2015-06-22 ENCOUNTER — Encounter: Payer: Self-pay | Admitting: Pulmonary Disease

## 2015-06-22 VITALS — BP 136/86 | HR 85 | Ht 65.0 in | Wt 265.8 lb

## 2015-06-22 DIAGNOSIS — G4733 Obstructive sleep apnea (adult) (pediatric): Secondary | ICD-10-CM | POA: Diagnosis not present

## 2015-06-22 DIAGNOSIS — E662 Morbid (severe) obesity with alveolar hypoventilation: Secondary | ICD-10-CM

## 2015-06-22 DIAGNOSIS — J9611 Chronic respiratory failure with hypoxia: Secondary | ICD-10-CM | POA: Diagnosis not present

## 2015-06-22 DIAGNOSIS — Z6841 Body Mass Index (BMI) 40.0 and over, adult: Secondary | ICD-10-CM

## 2015-06-22 NOTE — Patient Instructions (Signed)
Will arrange for sleep study Will call to arrange for follow up after sleep study reviewed 

## 2015-06-22 NOTE — Progress Notes (Signed)
Past Surgical History She  has past surgical history that includes Carpal tunnel release; ablasion; Cholecystectomy; Rotator cuff repair; and Tubal ligation.  Allergies  Allergen Reactions  . Codeine Itching  . Lisinopril Cough    Family History Her family history includes COPD in her mother; Cancer in her maternal grandfather and mother; Diabetes in her maternal grandfather, maternal grandmother, and mother; Hypertension in her maternal grandfather, maternal grandmother, and sister; Irritable bowel syndrome in her sister and sister; Stroke in her father and paternal grandfather.  Social History She  reports that she quit smoking about 4 years ago. Her smoking use included Cigarettes. She has a 15 pack-year smoking history. She has never used smokeless tobacco. She reports that she does not drink alcohol or use illicit drugs.  Review of systems C/o headaches.  Current Outpatient Prescriptions on File Prior to Visit  Medication Sig  . amLODipine (NORVASC) 10 MG tablet Take 5 mg by mouth daily.   Marland Kitchen glipiZIDE (GLUCOTROL) 5 MG tablet Take 10 mg by mouth 2 (two) times daily.   Marland Kitchen ibuprofen (ADVIL,MOTRIN) 600 MG tablet Take 1 tablet (600 mg total) by mouth every 6 (six) hours as needed for pain.  Marland Kitchen losartan (COZAAR) 100 MG tablet Take 100 mg by mouth daily.  . metroNIDAZOLE (METROCREAM) 0.75 % cream Apply 1 application topically 2 (two) times daily.   No current facility-administered medications on file prior to visit.    Chief Complaint  Patient presents with  . SLEEP CONSULT    Referred by Dr Sherril Croon. Sleep Study in [place], [place]. Current CPAP- not using every night d/t work. Uses 2L O2 at night. DME: [facility] [place]  Epworth Score: 0    Tests:  Past medical history She  has a past medical history of Hypertension; Diabetes mellitus without complication (HCC); Obesity; Hot flashes (10/24/2013); Moody (10/24/2013); and Sleep apnea.  Vital signs BP 136/86 mmHg  Pulse 85  Ht   (1.651 m)  Wt 265 lb 12.8 oz (120.566 kg)  BMI 44.23 kg/m2  SpO2 96%  History of Present Illness Bonnie Keith is a 55 y.o. female for evaluation of sleep problems.  She had sleep study about 10 yrs ago.  She has been on CPAP and 2 liters oxygen at night.  She doesn't keep up with her supplies.  Her machine is several years old.  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  She goes to sleep at 11 pm.  She falls asleep after 20 minutes.  She wakes up several times to use the bathroom.  She gets out of bed at 430 am.  She feels tired in the morning.  She denies morning headache.  She does not use anything to help her fall sleep or stay awake.  She denies sleep walking, sleep talking, bruxism, or nightmares.  There is no history of restless legs.  She denies sleep hallucinations, sleep paralysis, or cataplexy.  The Epworth score is 0 out of 24.   Physical Exam:  General - No distress ENT - No sinus tenderness, no oral exudate, no LAN, no thyromegaly, TM clear, pupils equal/reactive, MP 3 Cardiac - s1s2 regular, no murmur, pulses symmetric Chest - No wheeze/rales/dullness, good air entry, normal respiratory excursion Back - No focal tenderness Abd - Soft, non-tender, no organomegaly, + bowel sounds Ext - ankle edema Neuro - Normal strength, cranial nerves intact  Skin - No rashes Psych - Normal mood, and behavior  Discussion: She has hx of sleep apnea.  She has been on CPAP and supplemental oxygen.  She has gained weight since original CPAP set up.  She likely also has sleep related hypoxia/hypoventilation.  Assessment/plan:  Obstructive sleep apnea. - will arrange for repeat in lab sleep study  Chronic respiratory failure with hypoxia likely from obesity hypoventilation syndrome. - will determine if she can use BiPAP and/or continue supplemental oxygen after review of her sleep  study  Obesity. - discussed importance of weight loss   Patient Instructions  Will arrange for sleep study Will call to arrange for follow up after sleep study reviewed      Coralyn HellingVineet Tayona Sarnowski, M.D. Pager [phone] 06/22/2015, 2:45 PM

## 2015-07-16 ENCOUNTER — Telehealth: Payer: Self-pay | Admitting: Pulmonary Disease

## 2015-07-16 NOTE — Telephone Encounter (Signed)
Error.Stanley A Dalton ° °

## 2015-07-16 NOTE — Telephone Encounter (Signed)
Caller changed mine error.Caren GriffinsStanley A Dalton

## 2015-07-19 ENCOUNTER — Telehealth: Payer: Self-pay | Admitting: Pulmonary Disease

## 2015-07-19 DIAGNOSIS — G4733 Obstructive sleep apnea (adult) (pediatric): Secondary | ICD-10-CM

## 2015-07-19 NOTE — Telephone Encounter (Signed)
-----   Message from Tobe SosSally E Ottinger sent at 07/15/2015  2:54 PM EDT ----- Elvina SidleHey just Lorain ChildesFYI [employer] denied the split night study i will need order for home study placed thanks libby

## 2015-07-19 NOTE — Telephone Encounter (Signed)
Insurance has denied in lab sleep study.  Will order home sleep study.  Will have my nurse notify pt of this requirement from her insurance company.

## 2015-07-21 NOTE — Telephone Encounter (Signed)
Pt aware of sleep study change.  Order placed. Nothing further needed.

## 2015-07-30 ENCOUNTER — Telehealth: Payer: Self-pay

## 2015-07-30 ENCOUNTER — Encounter: Payer: Self-pay | Admitting: "Endocrinology

## 2015-07-30 ENCOUNTER — Ambulatory Visit (INDEPENDENT_AMBULATORY_CARE_PROVIDER_SITE_OTHER): Payer: 59 | Admitting: "Endocrinology

## 2015-07-30 ENCOUNTER — Other Ambulatory Visit: Payer: Self-pay

## 2015-07-30 VITALS — BP 120/76 | HR 76 | Resp 18 | Ht 64.5 in | Wt 274.0 lb

## 2015-07-30 DIAGNOSIS — I10 Essential (primary) hypertension: Secondary | ICD-10-CM

## 2015-07-30 DIAGNOSIS — E1165 Type 2 diabetes mellitus with hyperglycemia: Secondary | ICD-10-CM | POA: Diagnosis not present

## 2015-07-30 DIAGNOSIS — Z9119 Patient's noncompliance with other medical treatment and regimen: Secondary | ICD-10-CM

## 2015-07-30 DIAGNOSIS — E118 Type 2 diabetes mellitus with unspecified complications: Secondary | ICD-10-CM

## 2015-07-30 DIAGNOSIS — IMO0002 Reserved for concepts with insufficient information to code with codable children: Secondary | ICD-10-CM

## 2015-07-30 DIAGNOSIS — Z91199 Patient's noncompliance with other medical treatment and regimen due to unspecified reason: Secondary | ICD-10-CM

## 2015-07-30 MED ORDER — ONETOUCH DELICA LANCETS 33G MISC: Status: AC

## 2015-07-30 MED ORDER — BASAGLAR KWIKPEN 100 UNIT/ML ~~LOC~~ SOPN: 40.0000 [IU] | PEN_INJECTOR | Freq: Every day | SUBCUTANEOUS | Status: DC

## 2015-07-30 MED ORDER — GLUCOSE BLOOD VI STRP: ORAL_STRIP | Status: DC

## 2015-07-30 MED ORDER — INSULIN GLARGINE 100 UNIT/ML SOLOSTAR PEN: 40.0000 [IU] | PEN_INJECTOR | Freq: Every day | SUBCUTANEOUS | Status: DC

## 2015-07-30 MED ORDER — DAPAGLIFLOZIN PROPANEDIOL 5 MG PO TABS
5.0000 mg | ORAL_TABLET | Freq: Every day | ORAL | Status: DC
Start: 2015-07-30 — End: 2015-07-30

## 2015-07-30 MED ORDER — CANAGLIFLOZIN 100 MG PO TABS: 100.0000 mg | ORAL_TABLET | Freq: Every day | ORAL | Status: DC

## 2015-07-30 MED ORDER — INSULIN PEN NEEDLE 31G X 8 MM MISC: 1.0000 | Status: AC

## 2015-07-30 NOTE — Patient Instructions (Signed)

## 2015-07-30 NOTE — Progress Notes (Signed)
Subjective:    Patient ID: Bonnie Keith, female    DOB: December 03, 1960, PCP Ignatius Speckinghruv B Vyas, MD   Past Medical History  Diagnosis Date  . Hypertension   . Diabetes mellitus without complication (HCC)   . Obesity   . Hot flashes 10/24/2013  . Moody 10/24/2013  . Sleep apnea    Past Surgical History  Procedure Laterality Date  . Carpal tunnel release    . Ablasion      cervical  . Cholecystectomy    . Rotator cuff repair    . Tubal ligation     Social History   Social History  . Marital Status: Divorced    Spouse Name: N/A  . Number of Children: N/A  . Years of Education: N/A   Occupational History  . Co-Manager     Social History Main Topics  . Smoking status: Former Smoker -- 0.50 packs/day for 30 years    Types: Cigarettes    Quit date: 01/10/2011  . Smokeless tobacco: Never Used  . Alcohol Use: No  . Drug Use: No  . Sexual Activity: No     Comment: ablation   Other Topics Concern  . None   Social History Narrative   Outpatient Encounter Prescriptions as of 07/30/2015  Medication Sig  . amLODipine (NORVASC) 10 MG tablet Take 5 mg by mouth daily.   . furosemide (LASIX) 20 MG tablet Take 20 mg by mouth daily.  Marland Kitchen. gabapentin (NEURONTIN) 100 MG capsule Take 100 mg by mouth at bedtime.  Marland Kitchen. ibuprofen (ADVIL,MOTRIN) 600 MG tablet Take 1 tablet (600 mg total) by mouth every 6 (six) hours as needed for pain.  Marland Kitchen. losartan (COZAAR) 100 MG tablet Take 100 mg by mouth daily.  . potassium chloride SA (K-DUR,KLOR-CON) 20 MEQ tablet Take 20 mEq by mouth daily.  . [DISCONTINUED] glipiZIDE (GLUCOTROL) 5 MG tablet Take 10 mg by mouth 2 (two) times daily.   . dapagliflozin propanediol (FARXIGA) 5 MG TABS tablet Take 5 mg by mouth daily.  . Insulin Glargine (LANTUS SOLOSTAR) 100 UNIT/ML Solostar Pen Inject 40 Units into the skin daily at 10 pm.  . Insulin Pen Needle (B-D ULTRAFINE III SHORT PEN) 31G X 8 MM MISC 1 each by Does not apply route as directed.  . [DISCONTINUED]  metroNIDAZOLE (METROCREAM) 0.75 % cream Apply 1 application topically 2 (two) times daily.   No facility-administered encounter medications on file as of 07/30/2015.   ALLERGIES: Allergies  Allergen Reactions  . Codeine Itching  . Lisinopril Cough   VACCINATION STATUS: Immunization History  Administered Date(s) Administered  . Influenza,inj,Quad PF,36+ Mos 10/10/2014  . Influenza-Unspecified 10/09/2013    Diabetes She presents for her follow-up diabetic visit. She has type 2 diabetes mellitus. Onset time: She was diagnosed at approximate age of 47 years. Her disease course has been worsening (She was seen by me in 2015 when she was provided with basal/bolus insulin for very high A1c of 9.5%. She did not maintain her follow-up. She shows With higher A1c of 11.7% and currently only on glipizide.). There are no hypoglycemic associated symptoms. Pertinent negatives for hypoglycemia include no confusion, headaches, pallor or seizures. Associated symptoms include fatigue, foot paresthesias, polydipsia, polyuria and visual change. Pertinent negatives for diabetes include no chest pain and no polyphagia. (She minimizes her symptoms from hyperglycemia. ) There are no hypoglycemic complications. Symptoms are worsening. Diabetic complications include peripheral neuropathy. Risk factors for coronary artery disease include diabetes mellitus, dyslipidemia, family history, hypertension, obesity,  sedentary lifestyle and tobacco exposure. Current diabetic treatment includes oral agent (monotherapy). She is compliant with treatment none of the time. Her weight is decreasing steadily (Currently losing weight.). She is following a generally unhealthy diet. When asked about meal planning, she reported none. She has not had a previous visit with a dietitian. Home blood sugar record trend: She did not bring any meter nor logs today. She admits that she is not monitoring blood glucose. She was given a sample of meter and  strips from office today. An ACE inhibitor/angiotensin II receptor blocker is being taken. Eye exam is not current.  Hypertension This is a chronic problem. The current episode started more than 1 year ago. The problem is controlled. Pertinent negatives include no chest pain, headaches, palpitations or shortness of breath. Risk factors for coronary artery disease include diabetes mellitus, family history, obesity, sedentary lifestyle and smoking/tobacco exposure. Past treatments include angiotensin blockers and calcium channel blockers.     Review of Systems  Constitutional: Positive for fatigue. Negative for fever, chills and unexpected weight change.  HENT: Negative for trouble swallowing and voice change.   Eyes: Negative for visual disturbance.  Respiratory: Negative for cough, shortness of breath and wheezing.   Cardiovascular: Negative for chest pain, palpitations and leg swelling.  Gastrointestinal: Negative for nausea, vomiting and diarrhea.  Endocrine: Positive for polydipsia and polyuria. Negative for cold intolerance, heat intolerance and polyphagia.  Musculoskeletal: Negative for myalgias and arthralgias.  Skin: Negative for color change, pallor, rash and wound.  Neurological: Positive for numbness. Negative for seizures and headaches.  Psychiatric/Behavioral: Negative for suicidal ideas and confusion.    Objective:    BP 120/76 mmHg  Pulse 76  Resp 18  Ht 5' 4.5" (1.638 m)  Wt 274 lb (124.286 kg)  BMI 46.32 kg/m2  SpO2 98%  Wt Readings from Last 3 Encounters:  07/30/15 274 lb (124.286 kg)  06/22/15 265 lb 12.8 oz (120.566 kg)  11/21/13 259 lb (117.482 kg)    Physical Exam  Constitutional: She is oriented to person, place, and time. She appears well-developed.  Obese.  HENT:  Head: Normocephalic and atraumatic.  Eyes: EOM are normal.  Neck: Normal range of motion. Neck supple. No tracheal deviation present. No thyromegaly present.  Cardiovascular: Normal rate and  regular rhythm.   Pulmonary/Chest: Effort normal and breath sounds normal.  Abdominal: Soft. Bowel sounds are normal. There is no tenderness. There is no guarding.  Musculoskeletal: Normal range of motion. She exhibits no edema.  Neurological: She is alert and oriented to person, place, and time. She has normal reflexes. No cranial nerve deficit. Coordination normal.  Skin: Skin is warm and dry. No rash noted. No erythema. No pallor.  Psychiatric:  Reluctant and noncompliant affect.      Diabetic Labs (most recent): Lab Results  Component Value Date   HGBA1C 11.7 06/18/2015   HGBA1C 8.9 01/15/2015   HGBA1C 9.5 02/26/2014     Lipid Panel ( most recent) Lipid Panel     Component Value Date/Time   CHOL 113 03/05/2015   TRIG 115 03/05/2015   HDL 43 03/05/2015   LDLCALC 47 03/05/2015       Assessment & Plan:   1. Uncontrolled type 2 diabetes mellitus with complication, without long-term current use of insulin (HCC)  - Her diabetes is  complicated by Noncompliance, obesity, peripheral neuropathy and patient remains at a high risk for more acute and chronic complications of diabetes which include CAD, CVA, CKD, retinopathy, and neuropathy.  These are all discussed in detail with the patient.  - She was seen by me in 2015 with A1c of 9.5%, was given insulin basal/bolus regimen. Patient did not return for follow-up. Patient came with no meter nor glucose profile, and  recent A1c of 11.7 %.  She is currently taking glipizide 10 mg only.  Recent labs reviewed.   - I have re-counseled the patient on diet management and weight loss  by adopting a carbohydrate restricted / protein rich  Diet.  - Suggestion is made for patient to avoid simple carbohydrates   from their diet including Cakes , Desserts, Ice Cream,  Soda (  diet and regular) , Sweet Tea , Candies,  Chips, Cookies, Artificial Sweeteners,   and "Sugar-free" Products .  This will help patient to have stable blood glucose profile  and potentially avoid unintended  Weight gain.  - Patient is advised to stick to a routine mealtimes to eat 3 meals  a day and avoid unnecessary snacks ( to snack only to correct hypoglycemia).  - The patient  will be  scheduled with Norm Salt, RDN, CDE for individualized DM education.  - I have approached patient with the following individualized plan to manage diabetes and patient agrees.  - Unfortunately, patient remains at alarming to the noncompliant. I spent 30 minutes of time to counsel her, reluctantly accepts to engage. -  I will proceed with basal insulin Basaglar 40 units QHS,  associated with strict monitoring of glucose  AC and HS. -She would likely require bolus insulin once her commitment is achieved and based on her blood glucose readings in one week.  -Adjustment parameters are given for hypo and hyperglycemia in writing. -Patient is encouraged to call clinic for blood glucose levels less than 70 or above 300 mg /dl. - I have discussed and will add Invokana  by mouth every morning, therapeutically suitable for patient, side effects and precautions discussed with her. - I advised her to discontinue glipizide, risk outweighs benefit in this patient. - Patient will be considered for incretin therapy as appropriate next visit. - Patient specific target  for A1c; LDL, HDL, Triglycerides, and  Waist Circumference were discussed in detail.  2) BP/HTN: Controlled. Continue current medications including ACEI/ARB. 3) Lipids/HPL: Controlled LDL at 47, she is not on statins. 4)  Weight/Diet: CDE consult in progress, exercise, and carbohydrates information provided.  5) Chronic Care/Health Maintenance:  -Patient is on ARB and encouraged to continue to follow up with Ophthalmology, Podiatrist at least yearly or according to recommendations, and advised to  stay away from smoking. I have recommended yearly flu vaccine and pneumonia vaccination at least every 5 years; moderate  intensity exercise for up to 150 minutes weekly; and  sleep for at least 7 hours a day.  - 45 minutes of time was spent on the care of this patient , 50% of which was applied for counseling on diabetes complications and their preventions.  - I advised patient to maintain close follow up with Ignatius Specking, MD for primary care needs.  Patient is asked to bring meter and  blood glucose logs during their next visit.   Follow up plan: -Return in about 1 week (around 08/06/2015) for follow up with meter and logs- no labs.  Marquis Lunch, MD Phone: [phone]  Fax: [phone]   07/30/2015, 11:56 AM

## 2015-08-02 NOTE — Telephone Encounter (Signed)
Medications changed and sent to pharmacy

## 2015-08-06 ENCOUNTER — Ambulatory Visit: Payer: 59 | Admitting: "Endocrinology

## 2015-08-09 ENCOUNTER — Encounter: Payer: Self-pay | Admitting: "Endocrinology

## 2015-08-10 ENCOUNTER — Encounter: Payer: Self-pay | Admitting: "Endocrinology

## 2015-08-11 DIAGNOSIS — G4733 Obstructive sleep apnea (adult) (pediatric): Secondary | ICD-10-CM | POA: Diagnosis not present

## 2015-08-12 ENCOUNTER — Encounter (HOSPITAL_BASED_OUTPATIENT_CLINIC_OR_DEPARTMENT_OTHER): Payer: 59

## 2015-08-13 ENCOUNTER — Ambulatory Visit (INDEPENDENT_AMBULATORY_CARE_PROVIDER_SITE_OTHER): Payer: 59 | Admitting: "Endocrinology

## 2015-08-13 ENCOUNTER — Encounter: Payer: Self-pay | Admitting: "Endocrinology

## 2015-08-13 VITALS — BP 136/74 | HR 60 | Resp 18 | Ht 64.5 in | Wt 267.0 lb

## 2015-08-13 DIAGNOSIS — I10 Essential (primary) hypertension: Secondary | ICD-10-CM | POA: Diagnosis not present

## 2015-08-13 DIAGNOSIS — Z9119 Patient's noncompliance with other medical treatment and regimen: Secondary | ICD-10-CM

## 2015-08-13 DIAGNOSIS — E1165 Type 2 diabetes mellitus with hyperglycemia: Secondary | ICD-10-CM

## 2015-08-13 DIAGNOSIS — IMO0002 Reserved for concepts with insufficient information to code with codable children: Secondary | ICD-10-CM

## 2015-08-13 DIAGNOSIS — E118 Type 2 diabetes mellitus with unspecified complications: Secondary | ICD-10-CM

## 2015-08-13 DIAGNOSIS — Z91199 Patient's noncompliance with other medical treatment and regimen due to unspecified reason: Secondary | ICD-10-CM

## 2015-08-13 NOTE — Progress Notes (Signed)
Subjective:    Patient ID: Bonnie Keith, female    DOB: [birth year], PCP Bonnie Specking, MD   Past Medical History:  Diagnosis Date  . Diabetes mellitus without complication (HCC)   . Hot flashes 10/24/2013  . Hypertension   . Moody 10/24/2013  . Obesity   . Sleep apnea    Past Surgical History:  Procedure Laterality Date  . ablasion     cervical  . CARPAL TUNNEL RELEASE    . CHOLECYSTECTOMY    . ROTATOR CUFF REPAIR    . TUBAL LIGATION     Social History   Social History  . Marital status: Divorced    Spouse name: N/A  . Number of children: N/A  . Years of education: N/A   Occupational History  . Co-Manager     Social History Main Topics  . Smoking status: Former Smoker    Packs/day: 0.50    Years: 30.00    Types: Cigarettes    Quit date: 01/10/2011  . Smokeless tobacco: Never Used  . Alcohol use No  . Drug use: No  . Sexual activity: No     Comment: ablation   Other Topics Concern  . None   Social History Narrative  . None   Outpatient Encounter Prescriptions as of 08/13/2015  Medication Sig  . amLODipine (NORVASC) 10 MG tablet Take 5 mg by mouth daily.   . canagliflozin (INVOKANA) 100 MG TABS tablet Take 1 tablet (100 mg total) by mouth daily before breakfast.  . furosemide (LASIX) 20 MG tablet Take 20 mg by mouth daily.  Marland Kitchen gabapentin (NEURONTIN) 100 MG capsule Take 100 mg by mouth at bedtime.  Marland Kitchen glucose blood test strip Twice daily testing with One Touch Verio Flex Monitor  . ibuprofen (ADVIL,MOTRIN) 600 MG tablet Take 1 tablet (600 mg total) by mouth every 6 (six) hours as needed for pain.  . Insulin Glargine (BASAGLAR KWIKPEN Collins) Inject 50 Units into the skin at bedtime.  . Insulin Pen Needle (B-D ULTRAFINE III SHORT PEN) 31G X 8 MM MISC 1 each by Does not apply route as directed.  Marland Kitchen losartan (COZAAR) 100 MG tablet Take 100 mg by mouth daily.  Letta Pate DELICA LANCETS 33G MISC Twice daily testing  . potassium chloride SA (K-DUR,KLOR-CON) 20 MEQ  tablet Take 20 mEq by mouth daily.  . [DISCONTINUED] Insulin Glargine (BASAGLAR KWIKPEN) 100 UNIT/ML SOPN Inject 0.4 mLs (40 Units total) into the skin at bedtime.   No facility-administered encounter medications on file as of 08/13/2015.    ALLERGIES: Allergies  Allergen Reactions  . Codeine Itching  . Lisinopril Cough   VACCINATION STATUS: Immunization History  Administered Date(s) Administered  . Influenza,inj,Quad PF,36+ Mos 10/10/2014  . Influenza-Unspecified 10/09/2013    Diabetes  She presents for her follow-up diabetic visit. She has type 2 diabetes mellitus. Onset time: She was diagnosed at approximate age of 47 years. Her disease course has been improving (She was seen by me in 2015 when she was provided with basal/bolus insulin for very high A1c of 9.5%. She did not maintain her follow-up. She shows With higher A1c of 11.7% and currently only on glipizide.). There are no hypoglycemic associated symptoms. Pertinent negatives for hypoglycemia include no confusion, headaches, pallor or seizures. Associated symptoms include fatigue, foot paresthesias, polydipsia, polyuria and visual change. Pertinent negatives for diabetes include no chest pain and no polyphagia. (She minimizes her symptoms from hyperglycemia. ) There are no hypoglycemic complications. Symptoms are improving.  Diabetic complications include peripheral neuropathy. Risk factors for coronary artery disease include diabetes mellitus, dyslipidemia, family history, hypertension, obesity, sedentary lifestyle and tobacco exposure. Current diabetic treatment includes oral agent (monotherapy). She is compliant with treatment none of the time. Her weight is decreasing steadily (She lost 7 more pounds since last visit.). She is following a generally unhealthy diet. When asked about meal planning, she reported none. She has not had a previous visit with a dietitian. Her home blood glucose trend is decreasing steadily (She did not monitor  4 times a day, came with monitoring one to 2 times a day.). Her breakfast blood glucose range is generally 180-200 mg/dl. Her dinner blood glucose range is generally 180-200 mg/dl. Her overall blood glucose range is 180-200 mg/dl. An ACE inhibitor/angiotensin II receptor blocker is being taken. Eye exam is not current.  Hypertension  This is a chronic problem. The current episode started more than 1 year ago. The problem is controlled. Pertinent negatives include no chest pain, headaches, palpitations or shortness of breath. Risk factors for coronary artery disease include diabetes mellitus, family history, obesity, sedentary lifestyle and smoking/tobacco exposure. Past treatments include angiotensin blockers and calcium channel blockers.     Review of Systems  Constitutional: Positive for fatigue. Negative for chills, fever and unexpected weight change.  HENT: Negative for trouble swallowing and voice change.   Eyes: Negative for visual disturbance.  Respiratory: Negative for cough, shortness of breath and wheezing.   Cardiovascular: Negative for chest pain, palpitations and leg swelling.  Gastrointestinal: Negative for diarrhea, nausea and vomiting.  Endocrine: Positive for polydipsia and polyuria. Negative for cold intolerance, heat intolerance and polyphagia.  Musculoskeletal: Negative for arthralgias and myalgias.  Skin: Negative for color change, pallor, rash and wound.  Neurological: Positive for numbness. Negative for seizures and headaches.  Psychiatric/Behavioral: Negative for confusion and suicidal ideas.    Objective:    BP 136/74   Pulse 60   Resp 18   Ht 5' 4.5" (1.638 m)   Wt 267 lb (121.1 kg)   SpO2 98%   BMI 45.12 kg/m   Wt Readings from Last 3 Encounters:  08/13/15 267 lb (121.1 kg)  07/30/15 274 lb (124.3 kg)  06/22/15 265 lb 12.8 oz (120.6 kg)    Physical Exam  Constitutional: She is oriented to person, place, and time. She appears well-developed.  Obese.   HENT:  Head: Normocephalic and atraumatic.  Eyes: EOM are normal.  Neck: Normal range of motion. Neck supple. No tracheal deviation present. No thyromegaly present.  Cardiovascular: Normal rate and regular rhythm.   Pulmonary/Chest: Effort normal and breath sounds normal.  Abdominal: Soft. Bowel sounds are normal. There is no tenderness. There is no guarding.  Musculoskeletal: Normal range of motion. She exhibits no edema.  Neurological: She is alert and oriented to person, place, and time. She has normal reflexes. No cranial nerve deficit. Coordination normal.  Skin: Skin is warm and dry. No rash noted. No erythema. No pallor.  Psychiatric:  Reluctant and noncompliant affect.      Diabetic Labs (most recent): Lab Results  Component Value Date   HGBA1C 11.7 06/18/2015   HGBA1C 8.9 01/15/2015   HGBA1C 9.5 02/26/2014     Lipid Panel ( most recent) Lipid Panel     Component Value Date/Time   CHOL 113 03/05/2015   TRIG 115 03/05/2015   HDL 43 03/05/2015   LDLCALC 47 03/05/2015       Assessment & Plan:   1. Uncontrolled type  2 diabetes mellitus with complication, without long-term current use of insulin (HCC)  - Her diabetes is  complicated by Noncompliance, obesity, peripheral neuropathy and patient remains at a high risk for more acute and chronic complications of diabetes which include CAD, CVA, CKD, retinopathy, and neuropathy. These are all discussed in detail with the patient.  - She was seen by me in 2015 with A1c of 9.5%, was given insulin basal/bolus regimen. Patient did not return for follow-up. Patient came with no meter nor glucose profile, and  recent A1c of 11.7 %.  She is currently taking glipizide 10 mg only.  Recent labs reviewed.   - I have re-counseled the patient on diet management and weight loss  by adopting a carbohydrate restricted / protein rich  Diet.  - Suggestion is made for patient to avoid simple carbohydrates   from their diet including Cakes  , Desserts, Ice Cream,  Soda (  diet and regular) , Sweet Tea , Candies,  Chips, Cookies, Artificial Sweeteners,   and "Sugar-free" Products .  This will help patient to have stable blood glucose profile and potentially avoid unintended  Weight gain.  - Patient is advised to stick to a routine mealtimes to eat 3 meals  a day and avoid unnecessary snacks ( to snack only to correct hypoglycemia).  - The patient  will be  scheduled with Norm Salt, RDN, CDE for individualized DM education.  - I have approached patient with the following individualized plan to manage diabetes and patient agrees.  - Unfortunately, patient  cannot commit to monitoring 4 times a day.   - I will continue to maximize her basal insulin before considering bolus insulin, I will proceed with  Basaglar 50 units QHS,  associated with strict monitoring of glucose  AC breakfast and HS.   -Patient is encouraged to call clinic for blood glucose levels less than 70 or above 300 mg /dl. - I will continue Invokana  by mouth every morning, therapeutically suitable for patient, side effects and precautions discussed with her.  - Patient will be considered for incretin therapy as appropriate next visit. - Patient specific target  for A1c; LDL, HDL, Triglycerides, and  Waist Circumference were discussed in detail.  2) BP/HTN: Controlled. Continue current medications including ACEI/ARB. 3) Lipids/HPL: Controlled LDL at 47, she is not on statins. 4)  Weight/Diet: CDE consult in progress, exercise, and carbohydrates information provided.  5) Chronic Care/Health Maintenance:  -Patient is on ARB and encouraged to continue to follow up with Ophthalmology, Podiatrist at least yearly or according to recommendations, and advised to  stay away from smoking. I have recommended yearly flu vaccine and pneumonia vaccination at least every 5 years; moderate intensity exercise for up to 150 minutes weekly; and  sleep for at least 7 hours a  day.  - 25 minutes of time was spent on the care of this patient , 50% of which was applied for counseling on diabetes complications and their preventions.  - I advised patient to maintain close follow up with Bonnie Specking, MD for primary care needs.  Patient is asked to bring meter and  blood glucose logs during their next visit.   Follow up plan: -Return in about 8 weeks (around 10/08/2015) for follow up with pre-visit labs, meter, and logs.  Marquis Lunch, MD Phone: [phone]  Fax: [phone]   08/13/2015, 10:28 AM

## 2015-08-13 NOTE — Patient Instructions (Signed)

## 2015-08-18 ENCOUNTER — Telehealth: Payer: Self-pay | Admitting: Pulmonary Disease

## 2015-08-18 DIAGNOSIS — G4733 Obstructive sleep apnea (adult) (pediatric): Secondary | ICD-10-CM

## 2015-08-18 DIAGNOSIS — E662 Morbid (severe) obesity with alveolar hypoventilation: Secondary | ICD-10-CM

## 2015-08-18 DIAGNOSIS — J9611 Chronic respiratory failure with hypoxia: Secondary | ICD-10-CM

## 2015-08-18 NOTE — Telephone Encounter (Signed)
HST 08/11/15 >> AHI 17.4, SaO2 low 62%.   Will have my nurse inform pt that sleep study shows moderate sleep apnea with low oxygen levels.  She needs to have in lab titration study.  I have sent order for this, and will call to set up ROV after review of in lab titration study.

## 2015-08-18 NOTE — Telephone Encounter (Signed)
ATC, unable to reach. WCB Per Cordelia PenSherry patient is scheduled for 10/06/15 for study.

## 2015-08-19 DIAGNOSIS — G4733 Obstructive sleep apnea (adult) (pediatric): Secondary | ICD-10-CM | POA: Diagnosis not present

## 2015-08-20 ENCOUNTER — Other Ambulatory Visit: Payer: Self-pay | Admitting: *Deleted

## 2015-08-20 DIAGNOSIS — G4733 Obstructive sleep apnea (adult) (pediatric): Secondary | ICD-10-CM

## 2015-08-31 NOTE — Telephone Encounter (Signed)
I spoke with patient about results and she verbalized understanding and had no questions. She was already aware of appt for titration study.

## 2015-08-31 NOTE — Telephone Encounter (Signed)
LMOMTCB x1 on mobile Home # is not working

## 2015-08-31 NOTE — Telephone Encounter (Signed)
Pt returning call and can be reache @ [phone].Caren GriffinsStanley A Dalton

## 2015-08-31 NOTE — Telephone Encounter (Signed)
LMOMTCB x 1 

## 2015-09-14 ENCOUNTER — Telehealth: Payer: Self-pay | Admitting: Pulmonary Disease

## 2015-09-14 ENCOUNTER — Other Ambulatory Visit: Payer: Self-pay

## 2015-09-14 DIAGNOSIS — G4733 Obstructive sleep apnea (adult) (pediatric): Secondary | ICD-10-CM

## 2015-09-14 MED ORDER — BASAGLAR KWIKPEN 100 UNIT/ML ~~LOC~~ SOPN: 50.0000 [IU] | PEN_INJECTOR | Freq: Every day | SUBCUTANEOUS | 0 refills | Status: DC

## 2015-09-14 NOTE — Telephone Encounter (Signed)
CPAP titration study denied by [employer].  Please arrange for auto CPAP range 5 to 20 cm H2O with heated humidity and mask of choice.  Please arrange for ONO on auto CPAP.  Please schedule ROV with me or NP 2 months after starting auto CPAP.

## 2015-09-14 NOTE — Telephone Encounter (Signed)
-----   Message from Tobe SosSally E Ottinger sent at 09/14/2015  3:13 PM EDT ----- This pt's cpap titration study [facility] she will need to do auto on cpap 1st Tobe SosSally E Ottinger

## 2015-09-14 NOTE — Telephone Encounter (Signed)
Called and spoke with pt and she is aware of results per VS>  Order has been placed and pt is aware that our office will call and set this up for her.  She is aware that her sleep study has been cancelled for 9/27.

## 2015-09-14 NOTE — Telephone Encounter (Signed)
Pt returning call.Bonnie Keith ° °

## 2015-09-29 ENCOUNTER — Other Ambulatory Visit: Payer: Self-pay | Admitting: "Endocrinology

## 2015-09-29 LAB — TSH: TSH: 1.7 mIU/L

## 2015-09-29 LAB — T4, FREE: Free T4: 0.9 ng/dL (ref 0.8–1.8)

## 2015-09-30 LAB — MICROALBUMIN / CREATININE URINE RATIO
Creatinine, Urine: 117 mg/dL (ref 20–320)
MICROALB/CREAT RATIO: 6 ug/mg{creat} (ref <30)
Microalb, Ur: 0.7 mg/dL

## 2015-09-30 LAB — COMPLETE METABOLIC PANEL WITH GFR
ALT: 26 U/L (ref 6–29)
AST: 24 U/L (ref 10–35)
Albumin: 4 g/dL (ref 3.6–5.1)
Alkaline Phosphatase: 47 U/L (ref 33–130)
BILIRUBIN TOTAL: 0.3 mg/dL (ref 0.2–1.2)
BUN: 10 mg/dL (ref 7–25)
CO2: 25 mmol/L (ref 20–31)
Calcium: 9.4 mg/dL (ref 8.6–10.4)
Chloride: 103 mmol/L (ref 98–110)
Creat: 0.56 mg/dL (ref 0.50–1.05)
GFR, Est African American: >89 mL/min (ref >=60)
GLUCOSE: 155 mg/dL — AB (ref 65–99)
POTASSIUM: 4 mmol/L (ref 3.5–5.3)
SODIUM: 140 mmol/L (ref 135–146)
TOTAL PROTEIN: 6.5 g/dL (ref 6.1–8.1)

## 2015-09-30 LAB — LIPID PANEL
CHOL/HDL RATIO: 2.6 ratio (ref <=5.0)
CHOLESTEROL: 124 mg/dL — AB (ref 125–200)
HDL: 48 mg/dL (ref >=46)
LDL Cholesterol: 56 mg/dL (ref <130)
Triglycerides: 98 mg/dL (ref <150)
VLDL: 20 mg/dL (ref <30)

## 2015-09-30 LAB — HEMOGLOBIN A1C
Hgb A1c MFr Bld: 9.4 % — ABNORMAL HIGH (ref <5.7)
Mean Plasma Glucose: 223 mg/dL

## 2015-10-04 ENCOUNTER — Encounter (HOSPITAL_BASED_OUTPATIENT_CLINIC_OR_DEPARTMENT_OTHER): Payer: 59

## 2015-10-06 ENCOUNTER — Encounter (HOSPITAL_BASED_OUTPATIENT_CLINIC_OR_DEPARTMENT_OTHER): Payer: 59

## 2015-10-08 ENCOUNTER — Ambulatory Visit: Payer: 59 | Admitting: "Endocrinology

## 2015-10-13 ENCOUNTER — Ambulatory Visit: Payer: 59 | Admitting: Nutrition

## 2015-10-23 ENCOUNTER — Other Ambulatory Visit: Payer: Self-pay | Admitting: "Endocrinology

## 2015-11-07 ENCOUNTER — Other Ambulatory Visit: Payer: Self-pay | Admitting: "Endocrinology

## 2016-02-09 ENCOUNTER — Other Ambulatory Visit: Payer: Self-pay | Admitting: "Endocrinology

## 2016-06-12 ENCOUNTER — Ambulatory Visit: Payer: 59 | Admitting: Adult Health

## 2016-06-16 ENCOUNTER — Ambulatory Visit: Payer: 59 | Admitting: "Endocrinology

## 2016-06-19 ENCOUNTER — Other Ambulatory Visit: Payer: Self-pay | Admitting: "Endocrinology

## 2016-06-19 DIAGNOSIS — E119 Type 2 diabetes mellitus without complications: Secondary | ICD-10-CM

## 2016-06-19 DIAGNOSIS — E785 Hyperlipidemia, unspecified: Secondary | ICD-10-CM

## 2016-06-19 DIAGNOSIS — R232 Flushing: Secondary | ICD-10-CM

## 2016-07-14 ENCOUNTER — Other Ambulatory Visit: Payer: Self-pay | Admitting: "Endocrinology

## 2016-07-14 ENCOUNTER — Ambulatory Visit (INDEPENDENT_AMBULATORY_CARE_PROVIDER_SITE_OTHER): Payer: 59 | Admitting: Obstetrics & Gynecology

## 2016-07-14 ENCOUNTER — Encounter: Payer: Self-pay | Admitting: Obstetrics & Gynecology

## 2016-07-14 VITALS — BP 134/80 | HR 94 | Wt 257.0 lb

## 2016-07-14 DIAGNOSIS — N950 Postmenopausal bleeding: Secondary | ICD-10-CM | POA: Diagnosis not present

## 2016-07-14 DIAGNOSIS — R102 Pelvic and perineal pain: Secondary | ICD-10-CM | POA: Diagnosis not present

## 2016-07-14 LAB — COMPREHENSIVE METABOLIC PANEL
ALK PHOS: 62 U/L (ref 33–130)
ALT: 31 U/L — AB (ref 6–29)
AST: 30 U/L (ref 10–35)
Albumin: 4.1 g/dL (ref 3.6–5.1)
BUN: 11 mg/dL (ref 7–25)
CHLORIDE: 100 mmol/L (ref 98–110)
CO2: 26 mmol/L (ref 20–31)
CREATININE: 0.63 mg/dL (ref 0.50–1.05)
Calcium: 9.3 mg/dL (ref 8.6–10.4)
GLUCOSE: 146 mg/dL — AB (ref 65–99)
POTASSIUM: 4 mmol/L (ref 3.5–5.3)
SODIUM: 138 mmol/L (ref 135–146)
TOTAL PROTEIN: 6.8 g/dL (ref 6.1–8.1)
Total Bilirubin: 0.4 mg/dL (ref 0.2–1.2)

## 2016-07-14 NOTE — Progress Notes (Signed)
Chief Complaint  Patient presents with  . lower abdominal pain    Blood pressure 134/80, pulse 94, weight 257 lb (116.6 kg).  56 y.o. G2P0011 No LMP recorded. Patient has had an ablation. The current method of family planning is post menopausal status.  Outpatient Encounter Prescriptions as of 07/14/2016  Medication Sig  . amLODipine (NORVASC) 10 MG tablet Take 5 mg by mouth daily.   . furosemide (LASIX) 20 MG tablet Take 20 mg by mouth daily.  Marland Kitchen gabapentin (NEURONTIN) 100 MG capsule Take 100 mg by mouth at bedtime.  Marland Kitchen glucose blood test strip Twice daily testing with One Touch Verio Flex Monitor  . ibuprofen (ADVIL,MOTRIN) 600 MG tablet Take 1 tablet (600 mg total) by mouth every 6 (six) hours as needed for pain.  . Insulin Glargine (BASAGLAR KWIKPEN) 100 UNIT/ML SOPN INJECT SUBCUTANEOUSLY 50  UNITS AT BEDTIME  . Insulin Pen Needle (B-D ULTRAFINE III SHORT PEN) 31G X 8 MM MISC 1 each by Does not apply route as directed.  . INVOKANA 100 MG TABS tablet TAKE 1 TABLET BY MOUTH ONCE DAILY BEFORE BREAKFAST.  Marland Kitchen losartan (COZAAR) 100 MG tablet Take 100 mg by mouth daily.  Letta Pate DELICA LANCETS 33G MISC Twice daily testing  . potassium chloride SA (K-DUR,KLOR-CON) 20 MEQ tablet Take 20 mEq by mouth daily.   No facility-administered encounter medications on file as of 07/14/2016.     Subjective Patient presents today stating she is been having bilateral lower abdominal/pelvic pain for the last 3 months or so She states it lasts for about half a day once a week She has not identified any precipitating dietary or activity issues She states the pain is sharp and also crampy Not associated with diarrhea constipation or urinary tract symptoms She states that she has had some episodes of bleeding over the past few months She describes it as spotting pink there when she wipes does not wear a pad Has happened after intercourse and also other times infrequently This spotting is not  associated with her pain She was stated the intensity is moderate without radiation She does have a history of occasional back pain but this does not seem to be related  Objective General WDWN female NAD Abdominal exam is benign soft no masses nontender no rebound Vulva:  normal appearing vulva with no masses, tenderness or lesions Vagina:  normal mucosa, no discharge Cervix:  no cervical motion tenderness, no lesions and nulliparous appearance Uterus:  Not palpable Adnexa: ovaries:  Not palpable but no masses felt and nontender during exam   Pertinent ROS No burning with urination, frequency or urgency No nausea, vomiting or diarrhea Nor fever chills or other constitutional symptoms   Labs or studies No new scans or labs are contributory    Impression Diagnoses this Encounter::   ICD-10-CM   1. Pelvic pain R10.2 US Pelvis Complete    US Transvaginal Non-OB  2. PMB (postmenopausal bleeding) N95.0 US Pelvis Complete    US Transvaginal Non-OB    Established relevant diagnosis(es): Type 2 diabetes for approximately 3 years  Plan/Recommendations: No orders of the defined types were placed in this encounter.   Labs or Scans Ordered: Orders Placed This Encounter  Procedures  . US Pelvis Complete  . US Transvaginal Non-OB    Management:: A sonogram is ordered to evaluate the endometrium uterus and both ovaries for evaluation both the patient's pain as well as her statement of postmenopausal bleeding Depending on the findings  we may do an endometrial biopsy to evaluate the endometrial lining and other appropriate clinical management based on findings  Follow up Return in about 2 weeks (around 07/28/2016) for GYN sono, Follow up, with Dr Despina HiddenEure.    All questions were answered.  Past Medical History:  Diagnosis Date  . Diabetes mellitus without complication (HCC)   . Hot flashes 10/24/2013  . Hypertension   . Moody 10/24/2013  . Obesity   . Sleep apnea     Past  Surgical History:  Procedure Laterality Date  . ablasion     cervical  . CARPAL TUNNEL RELEASE    . CHOLECYSTECTOMY    . ROTATOR CUFF REPAIR    . TUBAL LIGATION      OB History    Gravida Para Term Preterm AB Living   2 1     1 1    SAB TAB Ectopic Multiple Live Births   1              Allergies  Allergen Reactions  . Codeine Itching  . Lisinopril Cough    Social History   Social History  . Marital status: Divorced    Spouse name: N/A  . Number of children: N/A  . Years of education: N/A   Occupational History  . Co-Manager     Social History Main Topics  . Smoking status: Former Smoker    Packs/day: 0.50    Years: 30.00    Types: Cigarettes    Quit date: 01/10/2011  . Smokeless tobacco: Never Used  . Alcohol use No  . Drug use: No  . Sexual activity: No     Comment: ablation   Other Topics Concern  . None   Social History Narrative  . None    Family History  Problem Relation Age of Onset  . Cancer Mother        lung  . Diabetes Mother   . COPD Mother   . Stroke Father   . Hypertension Sister   . Irritable bowel syndrome Sister   . Hypertension Maternal Grandmother   . Diabetes Maternal Grandmother   . Hypertension Maternal Grandfather   . Diabetes Maternal Grandfather   . Cancer Maternal Grandfather        black lung disease  . Stroke Paternal Grandfather   . Irritable bowel syndrome Sister

## 2016-07-15 LAB — LIPID PANEL
CHOL/HDL RATIO: 2.9 ratio (ref <5.0)
CHOLESTEROL: 138 mg/dL (ref <200)
HDL: 47 mg/dL — ABNORMAL LOW (ref >50)
LDL Cholesterol: 69 mg/dL (ref <100)
Triglycerides: 110 mg/dL (ref <150)
VLDL: 22 mg/dL (ref <30)

## 2016-07-15 LAB — HEMOGLOBIN A1C
Hgb A1c MFr Bld: 10.4 % — ABNORMAL HIGH (ref <5.7)
MEAN PLASMA GLUCOSE: 252 mg/dL

## 2016-07-15 LAB — T4, FREE: FREE T4: 1.1 ng/dL (ref 0.8–1.8)

## 2016-07-15 LAB — MICROALBUMIN / CREATININE URINE RATIO
CREATININE, URINE: 122 mg/dL (ref 20–320)
Microalb Creat Ratio: 8 mcg/mg creat (ref <30)
Microalb, Ur: 1 mg/dL

## 2016-07-15 LAB — TSH: TSH: 1.53 m[IU]/L

## 2016-07-28 ENCOUNTER — Ambulatory Visit: Payer: 59 | Admitting: Obstetrics & Gynecology

## 2016-07-28 ENCOUNTER — Other Ambulatory Visit: Payer: 59

## 2016-08-10 ENCOUNTER — Other Ambulatory Visit: Payer: Self-pay | Admitting: Adult Health

## 2016-08-10 DIAGNOSIS — Z1231 Encounter for screening mammogram for malignant neoplasm of breast: Secondary | ICD-10-CM

## 2016-08-11 ENCOUNTER — Encounter: Payer: Self-pay | Admitting: "Endocrinology

## 2016-08-11 ENCOUNTER — Ambulatory Visit (INDEPENDENT_AMBULATORY_CARE_PROVIDER_SITE_OTHER): Payer: 59 | Admitting: "Endocrinology

## 2016-08-11 VITALS — BP 142/78 | HR 72 | Ht 64.5 in | Wt 264.0 lb

## 2016-08-11 DIAGNOSIS — E118 Type 2 diabetes mellitus with unspecified complications: Secondary | ICD-10-CM | POA: Diagnosis not present

## 2016-08-11 DIAGNOSIS — I10 Essential (primary) hypertension: Secondary | ICD-10-CM | POA: Diagnosis not present

## 2016-08-11 DIAGNOSIS — IMO0002 Reserved for concepts with insufficient information to code with codable children: Secondary | ICD-10-CM

## 2016-08-11 DIAGNOSIS — E1165 Type 2 diabetes mellitus with hyperglycemia: Secondary | ICD-10-CM

## 2016-08-11 DIAGNOSIS — Z9119 Patient's noncompliance with other medical treatment and regimen: Secondary | ICD-10-CM

## 2016-08-11 DIAGNOSIS — Z91199 Patient's noncompliance with other medical treatment and regimen due to unspecified reason: Secondary | ICD-10-CM

## 2016-08-11 NOTE — Progress Notes (Signed)
Subjective:    Patient ID: Bonnie Keith, female    DOB: 02-06-1960, PCP Ignatius SpeckingVyas, Dhruv B, MD   Past Medical History:  Diagnosis Date  . Diabetes mellitus without complication (HCC)   . Hot flashes 10/24/2013  . Hypertension   . Moody 10/24/2013  . Obesity   . Sleep apnea    Past Surgical History:  Procedure Laterality Date  . ablasion     cervical  . CARPAL TUNNEL RELEASE    . CHOLECYSTECTOMY    . ROTATOR CUFF REPAIR    . TUBAL LIGATION     Social History   Social History  . Marital status: Divorced    Spouse name: N/A  . Number of children: N/A  . Years of education: N/A   Occupational History  . Co-Manager     Social History Main Topics  . Smoking status: Former Smoker    Packs/day: 0.50    Years: 30.00    Types: Cigarettes    Quit date: 01/10/2011  . Smokeless tobacco: Never Used  . Alcohol use No  . Drug use: No  . Sexual activity: No     Comment: ablation   Other Topics Concern  . None   Social History Narrative  . None   Outpatient Encounter Prescriptions as of 08/11/2016  Medication Sig  . amLODipine (NORVASC) 10 MG tablet Take 5 mg by mouth daily.   . furosemide (LASIX) 20 MG tablet Take 20 mg by mouth daily.  Marland Kitchen. gabapentin (NEURONTIN) 100 MG capsule Take 100 mg by mouth at bedtime.  Marland Kitchen. glucose blood test strip Twice daily testing with One Touch Verio Flex Monitor  . ibuprofen (ADVIL,MOTRIN) 600 MG tablet Take 1 tablet (600 mg total) by mouth every 6 (six) hours as needed for pain.  . Insulin Glargine (BASAGLAR KWIKPEN) 100 UNIT/ML SOPN INJECT SUBCUTANEOUSLY 50  UNITS AT BEDTIME  . Insulin Pen Needle (B-D ULTRAFINE III SHORT PEN) 31G X 8 MM MISC 1 each by Does not apply route as directed.  Marland Kitchen. losartan (COZAAR) 100 MG tablet Take 100 mg by mouth daily.  Letta Pate. ONETOUCH DELICA LANCETS 33G MISC Twice daily testing  . potassium chloride SA (K-DUR,KLOR-CON) 20 MEQ tablet Take 20 mEq by mouth daily.  . [DISCONTINUED] INVOKANA 100 MG TABS tablet TAKE 1 TABLET  BY MOUTH ONCE DAILY BEFORE BREAKFAST.   No facility-administered encounter medications on file as of 08/11/2016.    ALLERGIES: Allergies  Allergen Reactions  . Codeine Itching  . Lisinopril Cough   VACCINATION STATUS: Immunization History  Administered Date(s) Administered  . Influenza,inj,Quad PF,36+ Mos 10/10/2014  . Influenza-Unspecified 10/09/2013    Diabetes  She presents for her follow-up diabetic visit. She has type 2 diabetes mellitus. Onset time: She was diagnosed at approximate age of 47 years. Her disease course has been worsening (Patient is noncompliant , nonadherent, no showed for a year, returns with no meter nor logs and with A1c is still high at 10.4%.). There are no hypoglycemic associated symptoms. Pertinent negatives for hypoglycemia include no confusion, headaches, pallor or seizures. Associated symptoms include fatigue, foot paresthesias, polydipsia, polyuria and visual change. Pertinent negatives for diabetes include no chest pain and no polyphagia. ( ) There are no hypoglycemic complications. Symptoms are worsening. Diabetic complications include peripheral neuropathy. Risk factors for coronary artery disease include diabetes mellitus, dyslipidemia, family history, hypertension, obesity, sedentary lifestyle and tobacco exposure. Current diabetic treatment includes oral agent (monotherapy). She is compliant with treatment none of the time. Her  weight is increasing steadily. She is following a generally unhealthy diet. When asked about meal planning, she reported none. She has not had a previous visit with a dietitian. Her home blood glucose trend is decreasing steadily. (She did not bring any meter nor logs with her treatment today.) An ACE inhibitor/angiotensin II receptor blocker is being taken. Eye exam is not current.  Hypertension  This is a chronic problem. The current episode started more than 1 year ago. The problem is controlled. Pertinent negatives include no chest  pain, headaches, palpitations or shortness of breath. Risk factors for coronary artery disease include diabetes mellitus, family history, obesity, sedentary lifestyle and smoking/tobacco exposure. Past treatments include angiotensin blockers and calcium channel blockers.    Review of Systems  Constitutional: Positive for fatigue. Negative for chills, fever and unexpected weight change.  HENT: Negative for trouble swallowing and voice change.   Eyes: Negative for visual disturbance.  Respiratory: Negative for cough, shortness of breath and wheezing.   Cardiovascular: Negative for chest pain, palpitations and leg swelling.  Gastrointestinal: Negative for diarrhea, nausea and vomiting.  Endocrine: Positive for polydipsia and polyuria. Negative for cold intolerance, heat intolerance and polyphagia.  Musculoskeletal: Negative for arthralgias and myalgias.  Skin: Negative for color change, pallor, rash and wound.  Neurological: Positive for numbness. Negative for seizures and headaches.  Psychiatric/Behavioral: Negative for confusion and suicidal ideas.    Objective:    BP (!) 142/78   Pulse 72   Ht 5' 4.5" (1.638 m)   Wt 264 lb (119.7 kg)   BMI 44.62 kg/m   Wt Readings from Last 3 Encounters:  08/11/16 264 lb (119.7 kg)  07/14/16 257 lb (116.6 kg)  08/13/15 267 lb (121.1 kg)    Physical Exam  Constitutional: She is oriented to person, place, and time. She appears well-developed.  Obese.  HENT:  Head: Normocephalic and atraumatic.  Eyes: EOM are normal.  Neck: Normal range of motion. Neck supple. No tracheal deviation present. No thyromegaly present.  Cardiovascular: Normal rate and regular rhythm.   Pulmonary/Chest: Effort normal and breath sounds normal.  Abdominal: Soft. Bowel sounds are normal. There is no tenderness. There is no guarding.  Musculoskeletal: Normal range of motion. She exhibits no edema.  Neurological: She is alert and oriented to person, place, and time. She  has normal reflexes. No cranial nerve deficit. Coordination normal.  Skin: Skin is warm and dry. No rash noted. No erythema. No pallor.  Psychiatric:  Reluctant and noncompliant affect.      Diabetic Labs (most recent): Lab Results  Component Value Date   HGBA1C 10.4 (H) 07/14/2016   HGBA1C 9.4 (H) 09/29/2015   HGBA1C 11.7 06/18/2015     Lipid Panel ( most recent) Lipid Panel     Component Value Date/Time   CHOL 138 07/14/2016 1547   TRIG 110 07/14/2016 1547   HDL 47 (L) 07/14/2016 1547   CHOLHDL 2.9 07/14/2016 1547   VLDL 22 07/14/2016 1547   LDLCALC 69 07/14/2016 1547      Assessment & Plan:   1. Uncontrolled type 2 diabetes mellitus with complication, without long-term current use of insulin (HCC)  - Her diabetes is  complicated by Noncompliance/ Nonadherence, obesity, peripheral neuropathy and patient remains at a high risk for more acute and chronic complications of diabetes which include CAD, CVA, CKD, retinopathy, and neuropathy. These are all discussed in detail with the patient.  - She was seen by me in 2017 with A1c of  11.7%, was  given treatment plan. She no showed for a year and returns with no meter nor logs and A1c still high at 10.4%.    Recent labs reviewed.   - I have re-counseled the patient on diet management and weight loss  by adopting a carbohydrate restricted / protein rich  Diet.  - Suggestion is made for patient to avoid simple carbohydrates   from her diet including Cakes , Desserts, Ice Cream,  Soda (  diet and regular) , Sweet Tea , Candies,  Chips, Cookies, Artificial Sweeteners,   and "Sugar-free" Products .  This will help patient to have stable blood glucose profile and potentially avoid unintended  Weight gain.  - Patient is advised to stick to a routine mealtimes to eat 3 meals  a day and avoid unnecessary snacks ( to snack only to correct hypoglycemia).  - I have approached patient with the following individualized plan to manage  diabetes and patient agrees.  - Unfortunately, patient remains alarmingly noncompliant, cannot commit to monitoring of blood glucose for safe use of insulin.  - She reluctantly accepts to stay on basal R 50 units daily at bedtime, and initiation of strict monitoring of blood glucose 4 times a day-before meals and at bedtime and return in 2 weeks with her meter and logs for reevaluation. - If she commits properly, she will be considered for basal/bolus insulin.  -Patient is encouraged to call clinic for blood glucose levels less than 70 or above 300 mg /dl. - I will discontinue Invokana  due to non compliance for follow-up.   - Patient will be considered for incretin therapy as appropriate next visit. - Patient specific target  for A1c; LDL, HDL, Triglycerides, and  Waist Circumference were discussed in detail.  2) BP/HTN: uncontrolled. Continue current medications including ACEI/ARB. 3) Lipids/HPL: Controlled LDL at 47, she is not on statins. 4)  Weight/Diet:  She missed her appointment with the dietitian as well, I will repeat consult CDE for diabetes education, exercise, and carbohydrates information provided.  5) Chronic Care/Health Maintenance:  -Patient is on ARB and encouraged to continue to follow up with Ophthalmology, Podiatrist at least yearly or according to recommendations, and advised to  stay away from smoking. I have recommended yearly flu vaccine and pneumonia vaccination at least every 5 years; moderate intensity exercise for up to 150 minutes weekly; and  sleep for at least 7 hours a day.  - 20 minutes of time was spent on the care of this patient , 50% of which was applied for counseling on diabetes complications and their preventions.  - I advised patient to maintain close follow up with Ignatius Specking, MD for primary care needs.  Patient is asked to bring meter and  blood glucose logs during her next visit.   Follow up plan: -Return in about 2 weeks (around 08/25/2016)  for follow up with meter and logs- no labs.  Marquis Lunch, MD Phone: [phone]  Fax: [phone]   08/11/2016, 9:31 AM

## 2016-08-11 NOTE — Patient Instructions (Signed)

## 2016-08-16 ENCOUNTER — Other Ambulatory Visit: Payer: Self-pay | Admitting: "Endocrinology

## 2016-08-18 ENCOUNTER — Other Ambulatory Visit: Payer: Self-pay | Admitting: *Deleted

## 2016-08-18 MED ORDER — BASAGLAR KWIKPEN 100 UNIT/ML ~~LOC~~ SOPN: PEN_INJECTOR | SUBCUTANEOUS | 0 refills | Status: DC

## 2016-08-21 ENCOUNTER — Emergency Department (HOSPITAL_COMMUNITY)
Admission: EM | Admit: 2016-08-21 | Discharge: 2016-08-21 | Disposition: A | Discharge status: Home / Self Care | Payer: 59 | Attending: Emergency Medicine | Admitting: Emergency Medicine

## 2016-08-21 ENCOUNTER — Emergency Department (HOSPITAL_COMMUNITY): Payer: 59

## 2016-08-21 ENCOUNTER — Encounter (HOSPITAL_COMMUNITY): Payer: Self-pay

## 2016-08-21 DIAGNOSIS — E119 Type 2 diabetes mellitus without complications: Secondary | ICD-10-CM | POA: Diagnosis not present

## 2016-08-21 DIAGNOSIS — Z794 Long term (current) use of insulin: Secondary | ICD-10-CM | POA: Diagnosis not present

## 2016-08-21 DIAGNOSIS — Z79899 Other long term (current) drug therapy: Secondary | ICD-10-CM | POA: Insufficient documentation

## 2016-08-21 DIAGNOSIS — I10 Essential (primary) hypertension: Secondary | ICD-10-CM | POA: Insufficient documentation

## 2016-08-21 DIAGNOSIS — M25532 Pain in left wrist: Secondary | ICD-10-CM | POA: Diagnosis present

## 2016-08-21 DIAGNOSIS — Z87891 Personal history of nicotine dependence: Secondary | ICD-10-CM | POA: Insufficient documentation

## 2016-08-21 MED ORDER — IBUPROFEN 800 MG PO TABS
800.0000 mg | ORAL_TABLET | Freq: Once | ORAL | Status: AC
  Administered 2016-08-21: 800 mg via ORAL
  Filled 2016-08-21: qty 1

## 2016-08-21 MED ORDER — DICLOFENAC SODIUM 75 MG PO TBEC: 75.0000 mg | DELAYED_RELEASE_TABLET | Freq: Two times a day (BID) | ORAL | 0 refills | Status: DC

## 2016-08-21 NOTE — ED Provider Notes (Signed)
[facility]-EMERGENCY DEPT Provider Note   CSN: [id number] Arrival date & time: 08/21/16  1611     History   Chief Complaint Chief Complaint  Patient presents with  . Wrist Pain    HPI Bonnie Keith is a 56 y.o. female.  Patient is a 56 year old female who presents to the emergency department with complaint of left wrist pain.  The patient states that she has pain with movement of her thumb and movement of her wrist on. This is been going on for about 2 weeks. There's been no recent injury or trauma to be reported. The problem is worse with movement. It gets some better with rest, but this does not resolve the issue. Patient is taken ibuprofen, but states this is only minimally helpful.      Past Medical History:  Diagnosis Date  . Diabetes mellitus without complication (HCC)   . Hot flashes 10/24/2013  . Hypertension   . Moody 10/24/2013  . Obesity   . Sleep apnea     Patient Active Problem List   Diagnosis Date Noted  . Uncontrolled type 2 diabetes mellitus with complication, without long-term current use of insulin (HCC) 07/30/2015  . Essential hypertension, benign 07/30/2015  . Morbid obesity due to excess calories (HCC) 07/30/2015  . Personal history of noncompliance with medical treatment, presenting hazards to health 07/30/2015  . Hot flashes 10/24/2013  . Moody 10/24/2013  . 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 Pes anserinus bursitis 05/17/2012    Past Surgical History:  Procedure Laterality Date  . ablasion     cervical  . CARPAL TUNNEL RELEASE    . CHOLECYSTECTOMY    . ROTATOR CUFF REPAIR    . TUBAL LIGATION      OB History    Gravida Para Term Preterm AB Living   2 1     1 1    SAB TAB Ectopic Multiple Live Births   1               Home Medications    Prior to Admission medications   Medication Sig Start Date End Date Taking? Authorizing Provider  amLODipine (NORVASC) 10 MG tablet Take 5 mg by mouth daily.     [provider]    furosemide (LASIX) 20 MG tablet Take 20 mg by mouth daily.    [provider]  gabapentin (NEURONTIN) 100 MG capsule Take 100 mg by mouth at bedtime.    [provider]  ibuprofen (ADVIL,MOTRIN) 600 MG tablet Take 1 tablet (600 mg total) by mouth every 6 (six) hours as needed for pain. 02/18/12   Hairford, Ricki MillerAmber M, MD  Insulin Glargine (BASAGLAR KWIKPEN) 100 UNIT/ML SOPN INJECT SUBCUTANEOUSLY 50  UNITS AT BEDTIME 08/18/16   Nida, Denman GeorgeGebreselassie W, MD  Insulin Pen Needle (B-D ULTRAFINE III SHORT PEN) 31G X 8 MM MISC 1 each by Does not apply route as directed. 07/30/15   Roma KayserNida, Gebreselassie W, MD  losartan (COZAAR) 100 MG tablet Take 100 mg by mouth daily.    [provider]  Dola ArgyleNETOUCH DELICA LANCETS 33G MISC Twice daily testing 07/30/15   Roma KayserNida, Gebreselassie W, MD  [facility] VERIO test strip USE ONE STRIP TO TEST BLOOD GLUCOSE TWICE DAILY 08/16/16   Roma KayserNida, Gebreselassie W, MD  potassium chloride SA (K-DUR,KLOR-CON) 20 MEQ tablet Take 20 mEq by mouth daily.    [provider]    Family History Family History  Problem Relation Age of Onset  . Cancer Mother  lung  . Diabetes Mother   . COPD Mother   . Stroke Father   . Hypertension Sister   . Irritable bowel syndrome Sister   . Hypertension Maternal Grandmother   . Diabetes Maternal Grandmother   . Hypertension Maternal Grandfather   . Diabetes Maternal Grandfather   . Cancer Maternal Grandfather        black lung disease  . Stroke Paternal Grandfather   . Irritable bowel syndrome Sister     Social History Social History  Substance Use Topics  . Smoking status: Former Smoker    Packs/day: 0.50    Years: 30.00    Types: Cigarettes    Quit date: 01/10/2011  . Smokeless tobacco: Never Used  . Alcohol use No     Allergies   Codeine and Lisinopril   Review of Systems Review of Systems  Constitutional: Negative for activity change.       All ROS Neg except as noted in HPI  HENT: Negative for  nosebleeds.   Eyes: Negative for photophobia and discharge.  Respiratory: Negative for cough, shortness of breath and wheezing.   Cardiovascular: Negative for chest pain and palpitations.  Gastrointestinal: Negative for abdominal pain and blood in stool.  Genitourinary: Negative for dysuria, frequency and hematuria.  Musculoskeletal: Positive for arthralgias. Negative for back pain and neck pain.  Skin: Negative.   Neurological: Negative for dizziness, seizures and speech difficulty.  Psychiatric/Behavioral: Negative for confusion and hallucinations.     Physical Exam Updated Vital Signs BP (!) 162/69 (BP Location: Right Arm)   Pulse 74   Temp 97.7 F (36.5 C) (Oral)   Resp 15   Ht 5\' 4"  (1.626 m)   Wt 119.7 kg (264 lb)   SpO2 100%   BMI 45.32 kg/m   Physical Exam  Constitutional: She is oriented to person, place, and time. She appears well-developed and well-nourished.  Non-toxic appearance.  HENT:  Head: Normocephalic.  Right Ear: Tympanic membrane and external ear normal.  Left Ear: Tympanic membrane and external ear normal.  Eyes: Pupils are equal, round, and reactive to light. EOM and lids are normal.  Neck: Normal range of motion. Neck supple. Carotid bruit is not present.  Cardiovascular: Normal rate, regular rhythm, normal heart sounds, intact distal pulses and normal pulses.   Pulmonary/Chest: Breath sounds normal. No respiratory distress.  Abdominal: Soft. Bowel sounds are normal. There is no tenderness. There is no guarding.  Musculoskeletal:       Left wrist: She exhibits decreased range of motion and tenderness. She exhibits no swelling and no deformity.  There is pain at the scaphoid area and the remainder of the left wrist with flexion and extension of the thumb on the left. Also flexion and extension of the left wrist. No hot areas appreciated. Capillary refill is less than 2 seconds. Radial pulses 2+.  Lymphadenopathy:       Head (right side): No  submandibular adenopathy present.       Head (left side): No submandibular adenopathy present.    She has no cervical adenopathy.  Neurological: She is alert and oriented to person, place, and time. She has normal strength. No cranial nerve deficit or sensory deficit.  Skin: Skin is warm and dry.  Psychiatric: She has a normal mood and affect. Her speech is normal.  Nursing note and vitals reviewed.    ED Treatments / Results  Labs (all labs ordered are listed, but only abnormal results are displayed) Labs Reviewed - No data  to display  EKG  EKG Interpretation None       Radiology No results found.  Procedures Procedures (including critical care time)  Medications Ordered in ED Medications - No data to display   Initial Impression / Assessment and Plan / ED Course  I have reviewed the triage vital signs and the nursing notes.  Pertinent labs & imaging results that were available during my care of the patient were reviewed by me and considered in my medical decision making (see chart for details).       Final Clinical Impressions(s) / ED Diagnoses MDM Vital signs reviewed. X-ray of the left wrist is negative for fracture or dislocation. There is no evidence of any avascular changes.  Patient fitted with a thumb spica splint. Patient placed on diclofenac 2 times daily. Patient advised to see the orthopedic specialist for additional evaluation given that she is still having pain after an injury several years ago. Patient is in agreement with this plan.    Final diagnoses:  Left wrist pain    New Prescriptions New Prescriptions   DICLOFENAC (VOLTAREN) 75 MG EC TABLET    Take 1 tablet (75 mg total) by mouth 2 (two) times daily.     Ivery Quale, PA-C 08/21/16 1827    Vanetta Mulders, MD 08/21/16 [phone]

## 2016-08-21 NOTE — Discharge Instructions (Signed)
Please use your splint until seen by orthopedics. Use diclofenac 2 times daily with a meal. Use Tylenol in between the diclofenac doses if needed for pain and discomfort.

## 2016-08-21 NOTE — ED Triage Notes (Signed)
Left wrist pain x2 weeks. No recent injury. Full ROM noted.

## 2016-08-23 ENCOUNTER — Ambulatory Visit (INDEPENDENT_AMBULATORY_CARE_PROVIDER_SITE_OTHER): Payer: 59 | Admitting: Orthopaedic Surgery

## 2016-08-23 ENCOUNTER — Encounter: Payer: Self-pay | Admitting: Orthopaedic Surgery

## 2016-08-23 VITALS — BP 139/73 | HR 78 | Temp 98.0°F | Ht 66.0 in | Wt 268.0 lb

## 2016-08-23 DIAGNOSIS — M778 Other enthesopathies, not elsewhere classified: Secondary | ICD-10-CM

## 2016-08-23 NOTE — Progress Notes (Signed)
Subjective:    Patient ID: Bonnie Keith, female    DOB: [birth year], 56 y.o.   MRN: [id number]  HPI She has had pain of the left wrist area for about a month.  She has no trauma.  Her mother has lung cancer and she has to help lift her and be caregiver.  She has no redness.  She has been taking diclofenac.  She has a thumb splint given in the ER on 08-21-16.  I have reviewed the notes and the x-rays and x-ray reports.  She does not like the thumb splint. She is tired of the left nondominant hand hurting.   Review of Systems  HENT: Negative for congestion.   Respiratory: Negative for cough and shortness of breath.   Cardiovascular: Negative for chest pain and leg swelling.  Endocrine: Positive for cold intolerance.  Musculoskeletal: Positive for arthralgias.  Allergic/Immunologic: Positive for environmental allergies.  Psychiatric/Behavioral: Positive for sleep disturbance.   Past Medical History:  Diagnosis Date  . Diabetes mellitus without complication (HCC)   . Hot flashes 10/24/2013  . Hypertension   . Moody 10/24/2013  . Obesity   . Sleep apnea     Past Surgical History:  Procedure Laterality Date  . ablasion     cervical  . CARPAL TUNNEL RELEASE    . CHOLECYSTECTOMY    . ROTATOR CUFF REPAIR    . TUBAL LIGATION      Current Outpatient Prescriptions on File Prior to Visit  Medication Sig Dispense Refill  . amLODipine (NORVASC) 10 MG tablet Take 5 mg by mouth daily.     . diclofenac (VOLTAREN) 75 MG EC tablet Take 1 tablet (75 mg total) by mouth 2 (two) times daily. 12 tablet 0  . furosemide (LASIX) 20 MG tablet Take 20 mg by mouth daily.    Marland Kitchen. gabapentin (NEURONTIN) 100 MG capsule Take 100 mg by mouth at bedtime.    Marland Kitchen. ibuprofen (ADVIL,MOTRIN) 600 MG tablet Take 1 tablet (600 mg total) by mouth every 6 (six) hours as needed for pain. 30 tablet 0  . Insulin Glargine (BASAGLAR KWIKPEN) 100 UNIT/ML SOPN INJECT SUBCUTANEOUSLY 50  UNITS AT BEDTIME 45 mL 0  . Insulin Pen  Needle (B-D ULTRAFINE III SHORT PEN) 31G X 8 MM MISC 1 each by Does not apply route as directed. 100 each 3  . losartan (COZAAR) 100 MG tablet Take 100 mg by mouth daily.    Bonnie Keith. ONETOUCH DELICA LANCETS 33G MISC Twice daily testing 100 each 6  . ONETOUCH VERIO test strip USE ONE STRIP TO TEST BLOOD GLUCOSE TWICE DAILY 100 each 3  . potassium chloride SA (K-DUR,KLOR-CON) 20 MEQ tablet Take 20 mEq by mouth daily.     No current facility-administered medications on file prior to visit.     Social History   Social History  . Marital status: Divorced    Spouse name: N/A  . Number of children: N/A  . Years of education: N/A   Occupational History  . Co-Manager     Social History Main Topics  . Smoking status: Former Smoker    Packs/day: 0.50    Years: 30.00    Types: Cigarettes    Quit date: 01/10/2011  . Smokeless tobacco: Never Used  . Alcohol use No  . Drug use: No  . Sexual activity: No     Comment: ablation   Other Topics Concern  . Not on file   Social History Narrative  . No narrative on file  Family History  Problem Relation Age of Onset  . Cancer Mother        lung  . Diabetes Mother   . COPD Mother   . Stroke Father   . Hypertension Sister   . Irritable bowel syndrome Sister   . Hypertension Maternal Grandmother   . Diabetes Maternal Grandmother   . Hypertension Maternal Grandfather   . Diabetes Maternal Grandfather   . Cancer Maternal Grandfather        black lung disease  . Stroke Paternal Grandfather   . Irritable bowel syndrome Sister     BP 139/73   Pulse 78   Temp 98 F (36.7 C)   Ht 5\' 6"  (1.676 m)   Wt 268 lb (121.6 kg)   BMI 43.26 kg/m      Objective:   Physical Exam  Constitutional: She is oriented to person, place, and time. She appears well-developed and well-nourished.  HENT:  Head: Normocephalic and atraumatic.  Eyes: Pupils are equal, round, and reactive to light. Conjunctivae and EOM are normal.  Neck: Normal range of motion.  Neck supple.  Cardiovascular: Normal rate, regular rhythm and intact distal pulses.   Pulmonary/Chest: Effort normal.  Abdominal: Soft.  Musculoskeletal: She exhibits tenderness (Left wrist tender over the second compartment, no swelling, no redness.  first compartment has no tenderness.  ROM is full but tender.  NV intact.).  Neurological: She is alert and oriented to person, place, and time. She displays normal reflexes. No cranial nerve deficit. She exhibits normal muscle tone. Coordination normal.  Skin: Skin is warm and dry.  Psychiatric: She has a normal mood and affect. Her behavior is normal. Judgment and thought content normal.          Assessment & Plan:   Encounter Diagnosis  Name Primary?  . Left wrist tendinitis Yes   I have given wrist cock-up splint.  She is to use Aspercreme to the area tid.  Return in two weeks.  Consider injection if not improved.  Continue the diclofenac.  Call if any problem.  Precautions discussed.   Electronically Signed Darreld Mclean, MD 8/15/201810:52 AM

## 2016-08-25 ENCOUNTER — Ambulatory Visit (INDEPENDENT_AMBULATORY_CARE_PROVIDER_SITE_OTHER): Payer: 59 | Admitting: "Endocrinology

## 2016-08-25 ENCOUNTER — Other Ambulatory Visit: Payer: Self-pay

## 2016-08-25 ENCOUNTER — Encounter: Payer: Self-pay | Admitting: "Endocrinology

## 2016-08-25 VITALS — BP 148/78 | HR 86 | Ht 66.0 in | Wt 268.0 lb

## 2016-08-25 DIAGNOSIS — I10 Essential (primary) hypertension: Secondary | ICD-10-CM | POA: Diagnosis not present

## 2016-08-25 DIAGNOSIS — E1165 Type 2 diabetes mellitus with hyperglycemia: Secondary | ICD-10-CM

## 2016-08-25 DIAGNOSIS — Z9119 Patient's noncompliance with other medical treatment and regimen: Secondary | ICD-10-CM | POA: Diagnosis not present

## 2016-08-25 DIAGNOSIS — E118 Type 2 diabetes mellitus with unspecified complications: Secondary | ICD-10-CM | POA: Diagnosis not present

## 2016-08-25 DIAGNOSIS — Z91199 Patient's noncompliance with other medical treatment and regimen due to unspecified reason: Secondary | ICD-10-CM

## 2016-08-25 DIAGNOSIS — IMO0002 Reserved for concepts with insufficient information to code with codable children: Secondary | ICD-10-CM

## 2016-08-25 MED ORDER — BASAGLAR KWIKPEN 100 UNIT/ML ~~LOC~~ SOPN: PEN_INJECTOR | SUBCUTANEOUS | 0 refills | Status: DC

## 2016-08-25 MED ORDER — INSULIN ASPART 100 UNIT/ML FLEXPEN
10.0000 [IU] | PEN_INJECTOR | Freq: Three times a day (TID) | SUBCUTANEOUS | 2 refills | Status: AC
Start: 2016-08-25 — End: ?

## 2016-08-25 MED ORDER — INSULIN LISPRO 100 UNIT/ML (KWIKPEN): 10.0000 [IU] | PEN_INJECTOR | Freq: Three times a day (TID) | SUBCUTANEOUS | 2 refills | Status: DC

## 2016-08-25 NOTE — Progress Notes (Signed)
Subjective:    Patient ID: Bonnie Keith, female    DOB: [birth year], PCP Ignatius Specking, MD   Past Medical History:  Diagnosis Date  . Diabetes mellitus without complication (HCC)   . Hot flashes 10/24/2013  . Hypertension   . Moody 10/24/2013  . Obesity   . Sleep apnea    Past Surgical History:  Procedure Laterality Date  . ablasion     cervical  . CARPAL TUNNEL RELEASE    . CHOLECYSTECTOMY    . ROTATOR CUFF REPAIR    . TUBAL LIGATION     Social History   Social History  . Marital status: Divorced    Spouse name: N/A  . Number of children: N/A  . Years of education: N/A   Occupational History  . Co-Manager     Social History Main Topics  . Smoking status: Former Smoker    Packs/day: 0.50    Years: 30.00    Types: Cigarettes    Quit date: 01/10/2011  . Smokeless tobacco: Never Used  . Alcohol use No  . Drug use: No  . Sexual activity: No     Comment: ablation   Other Topics Concern  . None   Social History Narrative  . None   Outpatient Encounter Prescriptions as of 08/25/2016  Medication Sig  . amLODipine (NORVASC) 10 MG tablet Take 5 mg by mouth daily.   . diclofenac (VOLTAREN) 75 MG EC tablet Take 1 tablet (75 mg total) by mouth 2 (two) times daily.  . furosemide (LASIX) 20 MG tablet Take 20 mg by mouth daily.  Marland Kitchen gabapentin (NEURONTIN) 100 MG capsule Take 100 mg by mouth at bedtime.  Marland Kitchen ibuprofen (ADVIL,MOTRIN) 600 MG tablet Take 1 tablet (600 mg total) by mouth every 6 (six) hours as needed for pain.  Marland Kitchen insulin aspart (NOVOLOG FLEXPEN) 100 UNIT/ML FlexPen Inject 10-16 Units into the skin 3 (three) times daily with meals.  . Insulin Glargine (BASAGLAR KWIKPEN) 100 UNIT/ML SOPN INJECT SUBCUTANEOUSLY 60  UNITS AT BEDTIME  . Insulin Pen Needle (B-D ULTRAFINE III SHORT PEN) 31G X 8 MM MISC 1 each by Does not apply route as directed.  Marland Kitchen losartan (COZAAR) 100 MG tablet Take 100 mg by mouth daily.  Letta Pate DELICA LANCETS 33G MISC Twice daily testing  .  ONETOUCH VERIO test strip USE ONE STRIP TO TEST BLOOD GLUCOSE TWICE DAILY  . potassium chloride SA (K-DUR,KLOR-CON) 20 MEQ tablet Take 20 mEq by mouth daily.  . [DISCONTINUED] Insulin Glargine (BASAGLAR KWIKPEN) 100 UNIT/ML SOPN INJECT SUBCUTANEOUSLY 50  UNITS AT BEDTIME   No facility-administered encounter medications on file as of 08/25/2016.    ALLERGIES: Allergies  Allergen Reactions  . Codeine Itching  . Lisinopril Cough   VACCINATION STATUS: Immunization History  Administered Date(s) Administered  . Influenza,inj,Quad PF,36+ Mos 10/10/2014  . Influenza-Unspecified 10/09/2013    Diabetes  She presents for her follow-up diabetic visit. She has type 2 diabetes mellitus. Onset time: She was diagnosed at approximate age of 47 years. Her disease course has been worsening (Patient is noncompliant , nonadherent, no showed for a year, returns with a meter which has incomplete blood glucose readings, claims to have another meter, documents on her logs readings between 200-300 , recent A1c is still high at 10.4%.). There are no hypoglycemic associated symptoms. Pertinent negatives for hypoglycemia include no confusion, headaches, pallor or seizures. Associated symptoms include fatigue, foot paresthesias, polydipsia, polyuria and visual change. Pertinent negatives for diabetes include  no chest pain and no polyphagia. ( ) There are no hypoglycemic complications. Symptoms are worsening. Diabetic complications include peripheral neuropathy. Risk factors for coronary artery disease include diabetes mellitus, dyslipidemia, family history, hypertension, obesity, sedentary lifestyle and tobacco exposure. Current diabetic treatment includes oral agent (monotherapy). She is compliant with treatment none of the time. Her weight is stable. She is following a generally unhealthy diet. When asked about meal planning, she reported none. She has not had a previous visit with a dietitian. Her home blood glucose trend  is decreasing steadily. Her breakfast blood glucose range is generally >200 mg/dl. Her lunch blood glucose range is generally >200 mg/dl. Her dinner blood glucose range is generally >200 mg/dl. Her bedtime blood glucose range is generally >200 mg/dl. Her overall blood glucose range is >200 mg/dl. An ACE inhibitor/angiotensin II receptor blocker is being taken. Eye exam is not current.  Hypertension  This is a chronic problem. The current episode started more than 1 year ago. The problem is controlled. Pertinent negatives include no chest pain, headaches, palpitations or shortness of breath. Risk factors for coronary artery disease include diabetes mellitus, family history, obesity, sedentary lifestyle and smoking/tobacco exposure. Past treatments include angiotensin blockers and calcium channel blockers.    Review of Systems  Constitutional: Positive for fatigue. Negative for chills, fever and unexpected weight change.  HENT: Negative for trouble swallowing and voice change.   Eyes: Negative for visual disturbance.  Respiratory: Negative for cough, shortness of breath and wheezing.   Cardiovascular: Negative for chest pain, palpitations and leg swelling.  Gastrointestinal: Negative for diarrhea, nausea and vomiting.  Endocrine: Positive for polydipsia and polyuria. Negative for cold intolerance, heat intolerance and polyphagia.  Musculoskeletal: Negative for arthralgias and myalgias.  Skin: Negative for color change, pallor, rash and wound.  Neurological: Positive for numbness. Negative for seizures and headaches.  Psychiatric/Behavioral: Negative for confusion and suicidal ideas.    Objective:    BP (!) 148/78   Pulse 86   Ht 5\' 6"  (1.676 m)   Wt 268 lb (121.6 kg)   BMI 43.26 kg/m   Wt Readings from Last 3 Encounters:  08/25/16 268 lb (121.6 kg)  08/23/16 268 lb (121.6 kg)  08/21/16 264 lb (119.7 kg)    Physical Exam  Constitutional: She is oriented to person, place, and time. She  appears well-developed.  Obese.  HENT:  Head: Normocephalic and atraumatic.  Eyes: EOM are normal.  Neck: Normal range of motion. Neck supple. No tracheal deviation present. No thyromegaly present.  Cardiovascular: Normal rate and regular rhythm.   Pulmonary/Chest: Effort normal and breath sounds normal.  Abdominal: Soft. Bowel sounds are normal. There is no tenderness. There is no guarding.  Musculoskeletal: Normal range of motion. She exhibits no edema.  Neurological: She is alert and oriented to person, place, and time. She has normal reflexes. No cranial nerve deficit. Coordination normal.  Skin: Skin is warm and dry. No rash noted. No erythema. No pallor.  Psychiatric:  Reluctant and noncompliant affect.    Diabetic Labs (most recent): Lab Results  Component Value Date   HGBA1C 10.4 (H) 07/14/2016   HGBA1C 9.4 (H) 09/29/2015   HGBA1C 11.7 06/18/2015     Lipid Panel ( most recent) Lipid Panel     Component Value Date/Time   CHOL 138 07/14/2016 1547   TRIG 110 07/14/2016 1547   HDL 47 (L) 07/14/2016 1547   CHOLHDL 2.9 07/14/2016 1547   VLDL 22 07/14/2016 1547   LDLCALC 69 07/14/2016  1547     Assessment & Plan:   1. Uncontrolled type 2 diabetes mellitus with complication, without long-term current use of insulin (HCC) - She came with persistently hyperglycemia above 200 mg/dl. - Her diabetes is  complicated by Noncompliance/ Nonadherence, obesity, peripheral neuropathy and patient remains at a high risk for more acute and chronic complications of diabetes which include CAD, CVA, CKD, retinopathy, and neuropathy. These are all discussed in detail with the patient.  - She was seen by me in 2017 with A1c of  11.7%, was given treatment plan. She no showed for a year and returns with no meter nor logs and A1c still high at 10.4%.    Recent labs reviewed.   - I have re-counseled the patient on diet management and weight loss  by adopting a carbohydrate restricted / protein  rich  Diet.  Suggestion is made for her to avoid simple carbohydrates  from her diet including Cakes, Sweet Desserts, Ice Cream, Soda (diet and regular), Sweet Tea, Candies, Chips, Cookies, Store Bought Juices, Alcohol in Excess of  1-2 drinks a day, Artificial Sweeteners, and "Sugar-free" Products. This will help patient to have stable blood glucose profile and potentially avoid unintended weight gain.   - Patient is advised to stick to a routine mealtimes to eat 3 meals  a day and avoid unnecessary snacks ( to snack only to correct hypoglycemia).  - I have approached patient with the following individualized plan to manage diabetes and patient agrees.  - Patient has history of alarming noncompliance, cannot commit to monitoring of blood glucose for safe use of insulin.  -  Based on her current glycemic burden, she will require basal/bolus insulin to treat diabetes to target.  -She reluctantly accepts: I will increase Basaglar  To 60 units daily at bedtime,   initiate NovoLog 10-16 units 3 times a day before meals for pre-meal blood glucose above 90 mg/dL,   associated with strict monitoring of blood glucose 4 times a day-before meals and at bedtime and return in 2 weeks with her meter and logs for reevaluation.  -Patient is encouraged to call clinic for blood glucose levels less than 70 or above 300 mg /dl. - I will discontinue Invokana  due to non compliance for follow-up.  - Patient will be considered for incretin therapy as appropriate next visit. - Patient specific target  for A1c; LDL, HDL, Triglycerides, and  Waist Circumference were discussed in detail.  2) BP/HTN: uncontrolled. Continue current medications including ACEI/ARB. 3) Lipids/HPL: Controlled LDL at 47, she is not on statins. 4)  Weight/Diet:  She missed her appointment with the dietitian as well, I will repeat consult CDE for diabetes education, exercise, and carbohydrates information provided.  5) Chronic Care/Health  Maintenance:  -Patient is on ARB and encouraged to continue to follow up with Ophthalmology, Podiatrist at least yearly or according to recommendations, and advised to  stay away from smoking. I have recommended yearly flu vaccine and pneumonia vaccination at least every 5 years; moderate intensity exercise for up to 150 minutes weekly; and  sleep for at least 7 hours a day.  - Time spent with the patient: 25 min, of which >50% was spent in reviewing her sugar logs , discussing her hypo- and hyper-glycemic episodes, reviewing  previous labs and insulin doses and developing a plan to avoid hypo- and hyper-glycemia.   - I advised patient to maintain close follow up with Ignatius Specking, MD for primary care needs.  Patient is  asked to bring meter and  blood glucose logs during her next visit.   Follow up plan: -Return in about 2 weeks (around 09/08/2016) for follow up with meter and logs- no labs.  Marquis Lunch, MD Phone: [phone]  Fax: [phone]   08/25/2016, 12:17 PM

## 2016-08-31 ENCOUNTER — Ambulatory Visit (HOSPITAL_COMMUNITY)
Admission: RE | Admit: 2016-08-31 | Discharge: 2016-08-31 | Disposition: A | Discharge status: Home / Self Care | Payer: 59 | Source: Ambulatory Visit | Attending: Adult Health | Admitting: Adult Health

## 2016-08-31 DIAGNOSIS — Z1231 Encounter for screening mammogram for malignant neoplasm of breast: Secondary | ICD-10-CM | POA: Insufficient documentation

## 2016-09-06 ENCOUNTER — Ambulatory Visit: Payer: 59 | Admitting: Orthopaedic Surgery

## 2016-09-08 ENCOUNTER — Ambulatory Visit: Payer: 59 | Admitting: "Endocrinology

## 2016-09-29 ENCOUNTER — Ambulatory Visit: Payer: 59 | Admitting: "Endocrinology

## 2016-10-11 ENCOUNTER — Emergency Department (HOSPITAL_COMMUNITY)
Admission: EM | Admit: 2016-10-11 | Discharge: 2016-10-11 | Disposition: A | Discharge status: Home / Self Care | Payer: 59 | Attending: Emergency Medicine | Admitting: Emergency Medicine

## 2016-10-11 ENCOUNTER — Encounter (HOSPITAL_COMMUNITY): Payer: Self-pay | Admitting: Emergency Medicine

## 2016-10-11 ENCOUNTER — Ambulatory Visit: Payer: 59 | Admitting: Nutrition

## 2016-10-11 ENCOUNTER — Emergency Department (HOSPITAL_COMMUNITY): Payer: 59

## 2016-10-11 DIAGNOSIS — Z87891 Personal history of nicotine dependence: Secondary | ICD-10-CM | POA: Insufficient documentation

## 2016-10-11 DIAGNOSIS — R079 Chest pain, unspecified: Secondary | ICD-10-CM | POA: Insufficient documentation

## 2016-10-11 DIAGNOSIS — E119 Type 2 diabetes mellitus without complications: Secondary | ICD-10-CM | POA: Diagnosis not present

## 2016-10-11 DIAGNOSIS — Z79899 Other long term (current) drug therapy: Secondary | ICD-10-CM | POA: Diagnosis not present

## 2016-10-11 DIAGNOSIS — Z794 Long term (current) use of insulin: Secondary | ICD-10-CM | POA: Insufficient documentation

## 2016-10-11 DIAGNOSIS — I10 Essential (primary) hypertension: Secondary | ICD-10-CM | POA: Diagnosis not present

## 2016-10-11 DIAGNOSIS — Z7982 Long term (current) use of aspirin: Secondary | ICD-10-CM | POA: Diagnosis not present

## 2016-10-11 LAB — COMPREHENSIVE METABOLIC PANEL
ALT: 31 U/L (ref 14–54)
AST: 33 U/L (ref 15–41)
Albumin: 3.9 g/dL (ref 3.5–5.0)
Alkaline Phosphatase: 59 U/L (ref 38–126)
Anion gap: 8 (ref 5–15)
BUN: 9 mg/dL (ref 6–20)
CHLORIDE: 98 mmol/L — AB (ref 101–111)
CO2: 29 mmol/L (ref 22–32)
CREATININE: 0.59 mg/dL (ref 0.44–1.00)
Calcium: 9 mg/dL (ref 8.9–10.3)
GFR calc non Af Amer: >60 mL/min (ref >60)
Glucose, Bld: 223 mg/dL — ABNORMAL HIGH (ref 65–99)
Potassium: 3.9 mmol/L (ref 3.5–5.1)
Sodium: 135 mmol/L (ref 135–145)
Total Bilirubin: 0.3 mg/dL (ref 0.3–1.2)
Total Protein: 7.3 g/dL (ref 6.5–8.1)

## 2016-10-11 LAB — CBC WITH DIFFERENTIAL/PLATELET
Basophils Absolute: 0 10*3/uL (ref 0.0–0.1)
Basophils Relative: 0 %
EOS ABS: 0.2 10*3/uL (ref 0.0–0.7)
Eosinophils Relative: 3 %
HCT: 41.7 % (ref 36.0–46.0)
HEMOGLOBIN: 12.7 g/dL (ref 12.0–15.0)
LYMPHS ABS: 1.9 10*3/uL (ref 0.7–4.0)
Lymphocytes Relative: 37 %
MCH: 27.7 pg (ref 26.0–34.0)
MCHC: 30.5 g/dL (ref 30.0–36.0)
MCV: 91 fL (ref 78.0–100.0)
MONOS PCT: 9 %
Monocytes Absolute: 0.5 10*3/uL (ref 0.1–1.0)
NEUTROS PCT: 51 %
Neutro Abs: 2.7 10*3/uL (ref 1.7–7.7)
PLATELETS: 178 10*3/uL (ref 150–400)
RBC: 4.58 MIL/uL (ref 3.87–5.11)
RDW: 12.9 % (ref 11.5–15.5)
WBC: 5.2 10*3/uL (ref 4.0–10.5)

## 2016-10-11 LAB — TROPONIN I

## 2016-10-11 NOTE — ED Triage Notes (Signed)
PT c/o middle chest pain/pressure and left arm pain x1 week. PT denies any SOB.

## 2016-10-11 NOTE — ED Notes (Signed)
Pt with cp on left side off and on for past week, left arm swelling.

## 2016-10-11 NOTE — Discharge Instructions (Signed)
Test showed no life-threatening condition. Tylenol or ibuprofen for pain. Recommend follow-up with cardiologist. Phone number given.

## 2016-10-13 ENCOUNTER — Ambulatory Visit: Payer: 59 | Admitting: "Endocrinology

## 2016-10-13 NOTE — Progress Notes (Signed)
Cardiology Office Note   Date:  10/17/2016   ID:  TALONDA ARTIST, DOB 1960-01-22, MRN [id number]  PCP:  Ignatius Specking, MD  Cardiologist:   Charlton Haws, MD   No chief complaint on file.     History of Present Illness: Bonnie Keith is a 56 y.o. female who presents for consultation regarding chest pain. Very poorly controlled diabetic Due to non compliance with A!c in 10-11 range. HTN and HLD Seen in ED 10/11/16 but no physician notes. 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 Does not like to eat regular meals. Innvokana caused cramps in feet Has not Has more SSCP since ER visit Has had some swelling in LUE and dorsum of hand     Past Medical History:  Diagnosis Date  . Diabetes mellitus without complication (HCC)   . Hot flashes 10/24/2013  . Hypertension   . Moody 10/24/2013  . Obesity   . Sleep apnea     Past Surgical History:  Procedure Laterality Date  . ablasion     cervical  . CARPAL TUNNEL RELEASE    . CHOLECYSTECTOMY    . ROTATOR CUFF REPAIR    . TUBAL LIGATION       Current Outpatient Prescriptions  Medication Sig Dispense Refill  . amLODipine (NORVASC) 10 MG tablet Take 5 mg by mouth daily.     Marland Kitchen aspirin 325 MG tablet Take 325 mg by mouth daily.    . cetirizine (ZYRTEC) 10 MG tablet Take 10 mg by mouth daily.    . diclofenac (VOLTAREN) 75 MG EC tablet Take 1 tablet (75 mg total) by mouth 2 (two) times daily. 12 tablet 0  . furosemide (LASIX) 20 MG tablet Take 20 mg by mouth daily.    Marland Kitchen gabapentin (NEURONTIN) 100 MG capsule Take 100 mg by mouth at bedtime.    . insulin aspart (NOVOLOG FLEXPEN) 100 UNIT/ML FlexPen Inject 10-16 Units into the skin 3 (three) times daily with meals. 5 pen 2  . Insulin Glargine (BASAGLAR KWIKPEN) 100 UNIT/ML SOPN INJECT SUBCUTANEOUSLY 60  UNITS AT BEDTIME 45 mL 0  . Insulin Pen Needle (B-D ULTRAFINE III  SHORT PEN) 31G X 8 MM MISC 1 each by Does not apply route as directed. 100 each 3  . losartan (COZAAR) 100 MG tablet Take 100 mg by mouth daily.    Letta Pate DELICA LANCETS 33G MISC Twice daily testing 100 each 6  . ONETOUCH VERIO test strip USE ONE STRIP TO TEST BLOOD GLUCOSE TWICE DAILY 100 each 3  . venlafaxine XR (EFFEXOR-XR) 150 MG 24 hr capsule Take 150 mg by mouth daily.  5   No current facility-administered medications for this visit.     Allergies:   Codeine and Lisinopril    Social History:  The patient  reports that she quit smoking about 5 years ago. Her smoking use included Cigarettes. She has a 15.00 pack-year smoking history. She has never used smokeless tobacco. She reports that she does not drink alcohol or use drugs.   Family History:  The patient's family history includes COPD in her mother; Cancer in her maternal grandfather and mother; Diabetes in her maternal grandfather, maternal grandmother, and mother; Hypertension in her maternal grandfather, maternal grandmother, and sister; Irritable bowel syndrome in her sister and sister; Stroke in her father and paternal grandfather.  ROS:  Please see the history of present illness.   Otherwise, review of systems are positive for none.   All other systems are reviewed and negative.    PHYSICAL EXAM: VS:  BP 128/78 (BP Location: Left Arm)   Pulse 84   Ht  (1.626 m)   Wt 268 lb (121.6 kg)   SpO2 95%   BMI 46.00 kg/m  , BMI Body mass index is 46 kg/m. Affect appropriate Overweight black female  HEENT: normal Neck supple with no adenopathy JVP normal no bruits no thyromegaly Lungs clear with no wheezing and good diaphragmatic motion Heart:  S1/S2 no murmur, no rub, gallop or click PMI normal Abdomen: benighn, BS positve, no tenderness, no AAA no bruit.  No HSM or HJR Distal pulses intact with no bruits No edema Neuro non-focal Skin warm and dry No muscular weakness Mild edema LUE forearm and hand      EKG: 10/11/16 NSR rate 71 poor R wave progression    Recent Labs: 07/14/2016: TSH 1.53 10/11/2016: ALT 31; BUN 9; Creatinine, Ser 0.59; Hemoglobin 12.7; Platelets 178; Potassium 3.9; Sodium 135    Lipid Panel    Component Value Date/Time   CHOL 138 07/14/2016 1547   TRIG 110 07/14/2016 1547   HDL 47 (L) 07/14/2016 1547   CHOLHDL 2.9 07/14/2016 1547   VLDL 22 07/14/2016 1547   LDLCALC 69 07/14/2016 1547      Wt Readings from Last 3 Encounters:  10/17/16 268 lb (121.6 kg)  10/11/16 272 lb (123.4 kg)  08/25/16 268 lb (121.6 kg)      Other studies Reviewed: Additional studies/ records that were reviewed today include: Notes primary Diabetic doctor ER 10/11/16 ECG CXR and labs.    ASSESSMENT AND PLAN:  1.  Chest Pain atypical poorly controlled DM unable to walk well on treadmill f/u myovue  2. DM Discussed low carb diet.  Target hemoglobin A1c is 6.5 or less.  Continue current medications. 3. HTN Well controlled.  Continue current medications and low sodium Dash type diet.   4. Cholesterol continue statin labs with primary  5. Edema: LUE no trauma or iv start per patient f/u venous duplex had mamogram a few months ago ok    Current medicines are reviewed at length with the patient today.  The patient does not have concerns regarding medicines.  The following changes have been made:  no change  Labs/ tests ordered today include: Myovue UE venous duplex  No orders of the defined types were placed in this encounter.    Disposition:   FU with cardiology PRN      Signed, Charlton Haws, MD  10/17/2016 9:33 AM    [facility] [address] [place], [place], [place]  [address] Phone: [phone]; Fax: [phone]

## 2016-10-14 NOTE — ED Provider Notes (Signed)
[facility]-EMERGENCY DEPT Provider Note   CSN: [id number] Arrival date & time: 10/11/16  1036     History   Chief Complaint Chief Complaint  Patient presents with  . Chest Pain    HPI Bonnie Keith is a 56 y.o. female.  Intermittent chest pain for 2 weeks, each episode lasting 5-10 minutes, 4-5 times per day. No dyspnea, diaphoresis nausea. She does have diabetes and hypertension. No cigarette smoking. No family history of early MI.  No swelling in her legs. Severity of pain is mild. Nothing makes symptoms better or worse.      Past Medical History:  Diagnosis Date  . Diabetes mellitus without complication (HCC)   . Hot flashes 10/24/2013  . Hypertension   . Moody 10/24/2013  . Obesity   . Sleep apnea     Patient Active Problem List   Diagnosis Date Noted  . Uncontrolled type 2 diabetes mellitus with complication, without long-term current use of insulin (HCC) 07/30/2015  . Essential hypertension, benign 07/30/2015  . Morbid obesity due to excess calories (HCC) 07/30/2015  . Personal history of noncompliance with medical treatment, presenting hazards to health 07/30/2015  . Hot flashes 10/24/2013  . Moody 10/24/2013  . 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 Pes anserinus bursitis 05/17/2012    Past Surgical History:  Procedure Laterality Date  . ablasion     cervical  . CARPAL TUNNEL RELEASE    . CHOLECYSTECTOMY    . ROTATOR CUFF REPAIR    . TUBAL LIGATION      OB History    Gravida Para Term Preterm AB Living   SAB TAB Ectopic Multiple Live Births   1               Home Medications    Prior to Admission medications   Medication Sig Start Date End Date Taking? Authorizing Provider  amLODipine (NORVASC) 10 MG tablet Take 5 mg by mouth daily.    Yes [provider]  aspirin 325 MG tablet Take 325 mg by mouth daily.   Yes [provider]  cetirizine (ZYRTEC) 10 MG tablet Take 10 mg by mouth daily.   Yes [provider]  diclofenac (VOLTAREN) 75 MG EC tablet Take 1 tablet (75 mg total) by mouth 2 (two) times daily. 08/21/16  Yes Ivery Quale, PA-C  furosemide (LASIX) 20 MG tablet Take 20 mg by mouth daily.   Yes [provider]  gabapentin (NEURONTIN) 100 MG capsule Take 100 mg by mouth at bedtime.   Yes [provider]  insulin aspart (NOVOLOG FLEXPEN) 100 UNIT/ML FlexPen Inject 10-16 Units into the skin 3 (three) times daily with meals. 08/25/16  Yes Nida, Denman George, MD  Insulin Glargine (BASAGLAR KWIKPEN) 100 UNIT/ML SOPN INJECT SUBCUTANEOUSLY 60  UNITS AT BEDTIME 08/25/16  Yes Nida, Denman George, MD  Insulin Pen Needle (B-D ULTRAFINE III SHORT PEN) 31G X 8 MM MISC 1 each by Does not apply route as directed. 07/30/15  Yes Nida, Denman George, MD  losartan (COZAAR) 100 MG tablet Take 100 mg by mouth daily.   Yes [provider]  naproxen sodium (ANAPROX) 220 MG tablet Take 220 mg by mouth 2 (two) times daily with a meal.   Yes [provider]  [facility] DELICA LANCETS 33G MISC Twice daily testing 07/30/15  Yes Nida, Denman George, MD  ONETOUCH VERIO test strip USE ONE STRIP TO TEST BLOOD GLUCOSE TWICE DAILY  08/16/16  Yes Roma Kayser, MD  venlafaxine XR (EFFEXOR-XR) 150 MG 24 hr capsule Take 150 mg by mouth daily. 09/21/16  Yes [provider]  ibuprofen (ADVIL,MOTRIN) 600 MG tablet Take 1 tablet (600 mg total) by mouth every 6 (six) hours as needed for pain. Patient not taking: Reported on 10/11/2016 02/18/12   Hairford, Ricki Miller, MD    Family History Family History  Problem Relation Age of Onset  . Cancer Mother        lung  . Diabetes Mother   . COPD Mother   . Stroke Father   . Hypertension Sister   . Irritable bowel syndrome Sister   . Hypertension Maternal Grandmother   . Diabetes Maternal Grandmother   . Hypertension Maternal Grandfather   . Diabetes Maternal Grandfather   . Cancer Maternal Grandfather        black lung  disease  . Stroke Paternal Grandfather   . Irritable bowel syndrome Sister     Social History Social History  Substance Use Topics  . Smoking status: Former Smoker    Packs/day: 0.50    Years: 30.00    Types: Cigarettes    Quit date: 01/10/2011  . Smokeless tobacco: Never Used  . Alcohol use No     Allergies   Codeine and Lisinopril   Review of Systems Review of Systems  All other systems reviewed and are negative.    Physical Exam Updated Vital Signs BP 123/69   Pulse 74   Temp 98.1 F (36.7 C) (Oral)   Resp 14   Ht  (1.626 m)   Wt 123.4 kg (272 lb)   SpO2 97%   BMI 46.69 kg/m   Physical Exam  Constitutional: She is oriented to person, place, and time.  Elevated BMI  HENT:  Head: Normocephalic and atraumatic.  Eyes: Conjunctivae are normal.  Neck: Neck supple.  Cardiovascular: Normal rate and regular rhythm.   Pulmonary/Chest: Effort normal and breath sounds normal.  Abdominal: Soft. Bowel sounds are normal.  Musculoskeletal: Normal range of motion.  Neurological: She is alert and oriented to person, place, and time.  Skin: Skin is warm and dry.  Psychiatric: She has a normal mood and affect. Her behavior is normal.  Nursing note and vitals reviewed.    ED Treatments / Results  Labs (all labs ordered are listed, but only abnormal results are displayed) Labs Reviewed  COMPREHENSIVE METABOLIC PANEL - Abnormal; Notable for the following:       Result Value   Chloride 98 (*)    Glucose, Bld 223 (*)    All other components within normal limits  TROPONIN I  CBC WITH DIFFERENTIAL/PLATELET    EKG  EKG Interpretation  Date/Time:  Wednesday October 11 2016 10:43:38 EDT Ventricular Rate:  78 PR Interval:  150 QRS Duration: 84 QT Interval:  300 QTC Calculation: 342 R Axis:   19 Text Interpretation:  Normal sinus rhythm Cannot rule out Anterior infarct , age undetermined Abnormal ECG Confirmed by Donnetta Hutching ([id number]) on 10/11/2016 12:07:26 PM         Radiology No results found.  Procedures Procedures (including critical care time)  Medications Ordered in ED Medications - No data to display   Initial Impression / Assessment and Plan / ED Course  I have reviewed the triage vital signs and the nursing notes.  Pertinent labs & imaging results that were available during my care of the patient were reviewed by me and considered in my  medical decision making (see chart for details).     Patient does have risk factors for coronary artery disease, but her workup in the ED was acceptable. She has a primary care relationship. I strongly suggested cardiology follow-up.  Final Clinical Impressions(s) / ED Diagnoses   Final diagnoses:  Chest pain, unspecified type    New Prescriptions Discharge Medication List as of 10/11/2016  3:49 PM       Donnetta Hutching, MD 10/14/16 1149

## 2016-10-17 ENCOUNTER — Ambulatory Visit (INDEPENDENT_AMBULATORY_CARE_PROVIDER_SITE_OTHER): Payer: 59 | Admitting: Cardiovascular Disease

## 2016-10-17 ENCOUNTER — Encounter: Payer: Self-pay | Admitting: Cardiovascular Disease

## 2016-10-17 VITALS — BP 128/78 | HR 84 | Ht 64.0 in | Wt 268.0 lb

## 2016-10-17 DIAGNOSIS — R52 Pain, unspecified: Secondary | ICD-10-CM

## 2016-10-17 DIAGNOSIS — R0789 Other chest pain: Secondary | ICD-10-CM | POA: Diagnosis not present

## 2016-10-17 DIAGNOSIS — R079 Chest pain, unspecified: Secondary | ICD-10-CM

## 2016-10-17 NOTE — Addendum Note (Signed)
Addended by: Abelino Derrick R on: 10/17/2016 10:27 AM   Modules accepted: Orders

## 2016-10-17 NOTE — Patient Instructions (Signed)
Medication Instructions:  Your physician recommends that you continue on your current medications as directed. Please refer to the Current Medication list given to you today.  Labwork: NONE  Testing/Procedures: Your physician has requested that you have a lexiscan myoview. For further information please visit [email or web address] Please follow instruction sheet, as given.  Your physician has requested that you have a lower or upper extremity venous duplex. This test is an ultrasound of the veins in the legs or arms. It looks at venous blood flow that carries blood from the heart to the legs or arms. Allow one hour for a Lower Venous exam. Allow thirty minutes for an Upper Venous exam. There are no restrictions or special instructions.    Follow-Up: Your physician recommends that you schedule a follow-up appointment in: AS NEEDED   Any Other Special Instructions Will Be Listed Below (If Applicable).     If you need a refill on your cardiac medications before your next appointment, please call your pharmacy.

## 2016-10-27 ENCOUNTER — Encounter (HOSPITAL_BASED_OUTPATIENT_CLINIC_OR_DEPARTMENT_OTHER)
Admission: RE | Admit: 2016-10-27 | Discharge: 2016-10-27 | Disposition: A | Discharge status: Home / Self Care | Payer: 59 | Source: Ambulatory Visit | Attending: Cardiovascular Disease | Admitting: Cardiovascular Disease

## 2016-10-27 ENCOUNTER — Encounter (HOSPITAL_COMMUNITY)
Admission: RE | Admit: 2016-10-27 | Discharge: 2016-10-27 | Disposition: A | Discharge status: Home / Self Care | Payer: 59 | Source: Ambulatory Visit | Attending: Cardiovascular Disease | Admitting: Cardiovascular Disease

## 2016-10-27 ENCOUNTER — Encounter (HOSPITAL_COMMUNITY): Payer: Self-pay

## 2016-10-27 ENCOUNTER — Ambulatory Visit (HOSPITAL_COMMUNITY)
Admission: RE | Admit: 2016-10-27 | Discharge: 2016-10-27 | Disposition: A | Discharge status: Home / Self Care | Payer: 59 | Source: Ambulatory Visit | Attending: Cardiovascular Disease | Admitting: Cardiovascular Disease

## 2016-10-27 DIAGNOSIS — R079 Chest pain, unspecified: Secondary | ICD-10-CM | POA: Diagnosis present

## 2016-10-27 DIAGNOSIS — R52 Pain, unspecified: Secondary | ICD-10-CM

## 2016-10-27 LAB — NM MYOCAR MULTI W/SPECT W/WALL MOTION / EF
CHL CUP NUCLEAR SDS: 2
CHL CUP NUCLEAR SRS: 0
CHL CUP NUCLEAR SSS: 2
CSEPPHR: 95 {beats}/min
LV dias vol: 63 mL (ref 46–106)
LV sys vol: 32 mL
RATE: 0.3
Rest HR: 71 {beats}/min
TID: 1.13

## 2016-10-27 MED ORDER — REGADENOSON 0.4 MG/5ML IV SOLN
INTRAVENOUS | Status: AC
  Administered 2016-10-27: 0.4 mg via INTRAVENOUS
  Filled 2016-10-27: qty 5

## 2016-10-27 MED ORDER — TECHNETIUM TC 99M TETROFOSMIN IV KIT
30.0000 | PACK | Freq: Once | INTRAVENOUS | Status: AC | PRN
  Administered 2016-10-27: 30 via INTRAVENOUS

## 2016-10-27 MED ORDER — SODIUM CHLORIDE 0.9% FLUSH
INTRAVENOUS | Status: AC
  Administered 2016-10-27: 10 mL via INTRAVENOUS
  Filled 2016-10-27: qty 10

## 2016-10-27 MED ORDER — TECHNETIUM TC 99M TETROFOSMIN IV KIT
10.0000 | PACK | Freq: Once | INTRAVENOUS | Status: AC | PRN
  Administered 2016-10-27: 10 via INTRAVENOUS

## 2016-10-31 ENCOUNTER — Telehealth: Payer: Self-pay | Admitting: *Deleted

## 2016-10-31 NOTE — Telephone Encounter (Signed)
-----   Message from Wendall StadePeter C Nishan, MD sent at 10/30/2016  5:45 PM EDT ----- Normal myovue study with no evidence of ischemia or infarction

## 2016-11-03 ENCOUNTER — Other Ambulatory Visit: Payer: Self-pay | Admitting: "Endocrinology

## 2016-11-23 ENCOUNTER — Emergency Department (HOSPITAL_COMMUNITY)
Admission: EM | Admit: 2016-11-23 | Discharge: 2016-11-23 | Disposition: A | Discharge status: Home / Self Care | Payer: 59 | Attending: Emergency Medicine | Admitting: Emergency Medicine

## 2016-11-23 ENCOUNTER — Other Ambulatory Visit: Payer: Self-pay

## 2016-11-23 ENCOUNTER — Encounter (HOSPITAL_COMMUNITY): Payer: Self-pay | Admitting: *Deleted

## 2016-11-23 DIAGNOSIS — I10 Essential (primary) hypertension: Secondary | ICD-10-CM | POA: Insufficient documentation

## 2016-11-23 DIAGNOSIS — Z9049 Acquired absence of other specified parts of digestive tract: Secondary | ICD-10-CM | POA: Diagnosis not present

## 2016-11-23 DIAGNOSIS — J029 Acute pharyngitis, unspecified: Secondary | ICD-10-CM | POA: Diagnosis present

## 2016-11-23 DIAGNOSIS — Z794 Long term (current) use of insulin: Secondary | ICD-10-CM | POA: Insufficient documentation

## 2016-11-23 DIAGNOSIS — Z79899 Other long term (current) drug therapy: Secondary | ICD-10-CM | POA: Insufficient documentation

## 2016-11-23 DIAGNOSIS — Z87891 Personal history of nicotine dependence: Secondary | ICD-10-CM | POA: Diagnosis not present

## 2016-11-23 DIAGNOSIS — B309 Viral conjunctivitis, unspecified: Secondary | ICD-10-CM | POA: Diagnosis not present

## 2016-11-23 DIAGNOSIS — E119 Type 2 diabetes mellitus without complications: Secondary | ICD-10-CM | POA: Insufficient documentation

## 2016-11-23 DIAGNOSIS — R05 Cough: Secondary | ICD-10-CM | POA: Insufficient documentation

## 2016-11-23 DIAGNOSIS — Z7982 Long term (current) use of aspirin: Secondary | ICD-10-CM | POA: Insufficient documentation

## 2016-11-23 DIAGNOSIS — R059 Cough, unspecified: Secondary | ICD-10-CM

## 2016-11-23 LAB — RAPID STREP SCREEN (MED CTR MEBANE ONLY): Streptococcus, Group A Screen (Direct): NEGATIVE

## 2016-11-23 MED ORDER — BENZONATATE 200 MG PO CAPS: 200.0000 mg | ORAL_CAPSULE | Freq: Three times a day (TID) | ORAL | 0 refills | Status: DC | PRN

## 2016-11-23 MED ORDER — MAGIC MOUTHWASH W/LIDOCAINE: 5.0000 mL | Freq: Three times a day (TID) | ORAL | 0 refills | Status: DC | PRN

## 2016-11-23 MED ORDER — KETOROLAC TROMETHAMINE 0.5 % OP SOLN
1.0000 [drp] | Freq: Once | OPHTHALMIC | Status: AC
  Administered 2016-11-23: 1 [drp] via OPHTHALMIC
  Filled 2016-11-23: qty 5

## 2016-11-23 MED ORDER — AZITHROMYCIN 250 MG PO TABS: ORAL_TABLET | ORAL | 0 refills | Status: DC

## 2016-11-23 NOTE — ED Triage Notes (Signed)
Pt c/o eyes running, sore throat, left ear fullness, productive thick green colored cough, nose congestion since last Saturday. Denies fever.

## 2016-11-23 NOTE — Discharge Instructions (Signed)
Tylenol every 4 hrs if needed for fever.  Drink plenty of fluids.  One drop of the Acular to the left eye 4 times a day.  Follow-up with your doctor for recheck

## 2016-11-24 NOTE — ED Provider Notes (Signed)
[facility] EMERGENCY DEPARTMENT Provider Note   CSN: [id number] Arrival date & time: 11/23/16  1059     History   Chief Complaint Chief Complaint  Patient presents with  . Cough  . Sore Throat    HPI Bonnie Keith is a 56 y.o. female.  HPI  Bonnie Keith is a 56 y.o. female who presents to the Emergency Department complaining of sore throat, cough, nasal congestion and drainage and crusting of the left eye.  Symptoms present for 5 days.  Cough has been productive at times, but not associated with chest pain or shortness of breath.  She describes a "scratchy" sensation to her left eye and crusting in the mornings.  No visual changes, fever, headache, dizziness.  She has taken Allegra without relief.  Past Medical History:  Diagnosis Date  . Diabetes mellitus without complication (HCC)   . Hot flashes 10/24/2013  . Hypertension   . Moody 10/24/2013  . Obesity   . Sleep apnea     Patient Active Problem List   Diagnosis Date Noted  . Uncontrolled type 2 diabetes mellitus with complication, without long-term current use of insulin (HCC) 07/30/2015  . Essential hypertension, benign 07/30/2015  . Morbid obesity due to excess calories (HCC) 07/30/2015  . Personal history of noncompliance with medical treatment, presenting hazards to health 07/30/2015  . Hot flashes 10/24/2013  . Moody 10/24/2013  . 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 Pes anserinus bursitis 05/17/2012    Past Surgical History:  Procedure Laterality Date  . ablasion     cervical  . CARPAL TUNNEL RELEASE    . CHOLECYSTECTOMY    . ENDOMETRIAL ABLATION    . ROTATOR CUFF REPAIR    . TUBAL LIGATION      OB History    Gravida Para Term Preterm AB Living   2 1     1 1    SAB TAB Ectopic Multiple Live Births   1               Home Medications    Prior to Admission medications   Medication Sig Start Date End Date Taking? Authorizing Provider  amLODipine (NORVASC) 10 MG tablet Take 5 mg by  mouth daily.     [provider]  aspirin 325 MG tablet Take 325 mg by mouth daily.    [provider]  azithromycin (ZITHROMAX) 250 MG tablet Take first 2 tablets together, then 1 every day until finished. 11/23/16   Devontay Celaya, PA-C  benzonatate (TESSALON) 200 MG capsule Take 1 capsule (200 mg total) 3 (three) times daily as needed by mouth for cough. Swallow whole, do not chew 11/23/16   Jillienne Egner, PA-C  cetirizine (ZYRTEC) 10 MG tablet Take 10 mg by mouth daily.    [provider]  diclofenac (VOLTAREN) 75 MG EC tablet Take 1 tablet (75 mg total) by mouth 2 (two) times daily. 08/21/16   Ivery Quale, PA-C  furosemide (LASIX) 20 MG tablet Take 20 mg by mouth daily.    [provider]  gabapentin (NEURONTIN) 100 MG capsule Take 100 mg by mouth at bedtime.    [provider]  insulin aspart (NOVOLOG FLEXPEN) 100 UNIT/ML FlexPen Inject 10-16 Units into the skin 3 (three) times daily with meals. 08/25/16   Roma Kayser, MD  Insulin Glargine [facility] KWIKPEN) 100 UNIT/ML SOPN INJECT SUBCUTANEOUSLY 60 UNITS AT BEDTIME 11/03/16   Nida, Denman George, MD  Insulin Pen Needle (B-D ULTRAFINE III Keith PEN)  31G X 8 MM MISC 1 each by Does not apply route as directed. 07/30/15   Roma KayserNida, Gebreselassie W, MD  losartan (COZAAR) 100 MG tablet Take 100 mg by mouth daily.    [provider]  magic mouthwash w/lidocaine SOLN Take 5 mLs 3 (three) times daily as needed by mouth for mouth pain. Swish and spit, do not swallow 11/23/16   Pauline Ausriplett, Helayna Dun, PA-C  ONETOUCH DELICA LANCETS 33G MISC Twice daily testing 07/30/15   Roma KayserNida, Gebreselassie W, MD  [facility] test strip USE ONE STRIP TO TEST BLOOD GLUCOSE TWICE DAILY 08/16/16   Roma KayserNida, Gebreselassie W, MD  venlafaxine XR (EFFEXOR-XR) 150 MG 24 hr capsule Take 150 mg by mouth daily. 09/21/16   [provider]    Family History Family History  Problem Relation Age of Onset  . Cancer Mother         lung  . Diabetes Mother   . COPD Mother   . Stroke Father   . Hypertension Sister   . Irritable bowel syndrome Sister   . Hypertension Maternal Grandmother   . Diabetes Maternal Grandmother   . Hypertension Maternal Grandfather   . Diabetes Maternal Grandfather   . Cancer Maternal Grandfather        black lung disease  . Stroke Paternal Grandfather   . Irritable bowel syndrome Sister     Social History Social History   Tobacco Use  . Smoking status: Former Smoker    Packs/day: 0.50    Years: 30.00    Pack years: 15.00    Types: Cigarettes    Last attempt to quit: 01/10/2011    Years since quitting: 5.8  . Smokeless tobacco: Never Used  Substance Use Topics  . Alcohol use: No    Alcohol/week: 0.0 oz  . Drug use: No     Allergies   Codeine and Lisinopril   Review of Systems Review of Systems  Constitutional: Negative for appetite change, chills and fever.  HENT: Positive for congestion, rhinorrhea and sore throat. Negative for ear pain, facial swelling and trouble swallowing.   Eyes: Positive for pain and discharge. Negative for redness and itching.  Respiratory: Positive for cough. Negative for chest tightness, shortness of breath and wheezing.   Cardiovascular: Negative for chest pain.  Gastrointestinal: Negative for abdominal pain, nausea and vomiting.  Genitourinary: Negative for dysuria.  Musculoskeletal: Positive for myalgias. Negative for arthralgias, neck pain and neck stiffness.  Skin: Negative for rash.  Neurological: Negative for dizziness, facial asymmetry, weakness, numbness and headaches.  Hematological: Negative for adenopathy.  Psychiatric/Behavioral: Negative for confusion.  All other systems reviewed and are negative.    Physical Exam Updated Vital Signs BP 130/66   Pulse 85   Temp 98.4 F (36.9 C) (Oral)   Resp 18   Ht 5\' 4"  (1.626 m)   Wt 119.3 kg (263 lb)   SpO2 100%   BMI 45.14 kg/m   Physical Exam  Constitutional: She  is oriented to person, place, and time. She appears well-developed and well-nourished. No distress.  HENT:  Head: Atraumatic.  Right Ear: Tympanic membrane and ear canal normal.  Left Ear: Tympanic membrane and ear canal normal.  Nose: Mucosal edema present.  Mouth/Throat: Uvula is midline and mucous membranes are normal. No oral lesions. No trismus in the jaw. Posterior oropharyngeal erythema present. No oropharyngeal exudate, posterior oropharyngeal edema or tonsillar abscesses.  Eyes: EOM are normal. Pupils are equal, round, and reactive to light. Lids are everted and swept,  no foreign bodies found. Left eye exhibits exudate. Left eye exhibits no chemosis and no hordeolum. No foreign body present in the left eye. Left conjunctiva is not injected.  Neck: Normal range of motion.  Cardiovascular: Normal rate, regular rhythm and normal heart sounds.  Pulmonary/Chest: Effort normal and breath sounds normal. No stridor. No respiratory distress. She has no wheezes.  Musculoskeletal: Normal range of motion.  Lymphadenopathy:    She has no cervical adenopathy.  Neurological: She is alert and oriented to person, place, and time. No sensory deficit.  Skin: Skin is warm. Capillary refill takes less than 2 seconds. No rash noted.  Psychiatric: She has a normal mood and affect.  Nursing note and vitals reviewed.    ED Treatments / Results  Labs (all labs ordered are listed, but only abnormal results are displayed) Labs Reviewed  RAPID STREP SCREEN (NOT AT [facility])  CULTURE, GROUP A STREP [facility])    EKG  EKG Interpretation None       Radiology No results found.  Procedures Procedures (including critical care time)  Medications Ordered in ED Medications  ketorolac (ACULAR) 0.5 % ophthalmic solution 1 drop (1 drop Left Eye Given 11/23/16 1304)     Initial Impression / Assessment and Plan / ED Course  I have reviewed the triage vital signs and the nursing notes.  Pertinent labs &  imaging results that were available during my care of the patient were reviewed by me and considered in my medical decision making (see chart for details).     Pt well appearing, non-toxic.  conjunctivitis of the left eye.  No visual changes.  acular drops dispensed.  Pt stable for d/c.  Agrees tx plan.  Return precautions discussed.   Final Clinical Impressions(s) / ED Diagnoses   Final diagnoses:  Acute viral conjunctivitis of left eye  Cough  Pharyngitis, unspecified etiology    ED Discharge Orders        Ordered    azithromycin (ZITHROMAX) 250 MG tablet     11/23/16 1258    magic mouthwash w/lidocaine SOLN  3 times daily PRN     11/23/16 1258    benzonatate (TESSALON) 200 MG capsule  3 times daily PRN     11/23/16 1258       Pauline Ausriplett, Lennell Shanks, PA-C 11/24/16 1946    Samuel JesterMcManus, Kathleen, DO 11/27/16 2117

## 2016-11-26 LAB — CULTURE, GROUP A STREP (THRC)

## 2017-01-17 ENCOUNTER — Other Ambulatory Visit: Payer: Self-pay | Admitting: "Endocrinology

## 2017-01-24 ENCOUNTER — Other Ambulatory Visit: Payer: Self-pay | Admitting: "Endocrinology

## 2017-02-21 ENCOUNTER — Other Ambulatory Visit: Payer: Self-pay | Admitting: "Endocrinology

## 2017-05-03 ENCOUNTER — Other Ambulatory Visit: Payer: Self-pay | Admitting: "Endocrinology

## 2017-05-12 ENCOUNTER — Encounter (HOSPITAL_COMMUNITY): Payer: Self-pay | Admitting: Emergency Medicine

## 2017-05-12 ENCOUNTER — Other Ambulatory Visit: Payer: Self-pay

## 2017-05-12 ENCOUNTER — Emergency Department (HOSPITAL_COMMUNITY)
Admission: EM | Admit: 2017-05-12 | Discharge: 2017-05-12 | Disposition: A | Discharge status: Home / Self Care | Payer: 59 | Attending: Emergency Medicine | Admitting: Emergency Medicine

## 2017-05-12 DIAGNOSIS — Z87891 Personal history of nicotine dependence: Secondary | ICD-10-CM | POA: Diagnosis not present

## 2017-05-12 DIAGNOSIS — B349 Viral infection, unspecified: Secondary | ICD-10-CM | POA: Diagnosis not present

## 2017-05-12 DIAGNOSIS — I10 Essential (primary) hypertension: Secondary | ICD-10-CM | POA: Diagnosis not present

## 2017-05-12 DIAGNOSIS — R739 Hyperglycemia, unspecified: Secondary | ICD-10-CM

## 2017-05-12 DIAGNOSIS — Z794 Long term (current) use of insulin: Secondary | ICD-10-CM | POA: Diagnosis not present

## 2017-05-12 DIAGNOSIS — M791 Myalgia, unspecified site: Secondary | ICD-10-CM | POA: Diagnosis present

## 2017-05-12 DIAGNOSIS — Z79899 Other long term (current) drug therapy: Secondary | ICD-10-CM | POA: Insufficient documentation

## 2017-05-12 DIAGNOSIS — E1165 Type 2 diabetes mellitus with hyperglycemia: Secondary | ICD-10-CM | POA: Insufficient documentation

## 2017-05-12 LAB — CBC WITH DIFFERENTIAL/PLATELET
BASOS PCT: 0 %
Basophils Absolute: 0 10*3/uL (ref 0.0–0.1)
EOS ABS: 0 10*3/uL (ref 0.0–0.7)
EOS PCT: 0 %
HCT: 43.4 % (ref 36.0–46.0)
Hemoglobin: 13.3 g/dL (ref 12.0–15.0)
LYMPHS PCT: 25 %
Lymphs Abs: 2.1 10*3/uL (ref 0.7–4.0)
MCH: 27.5 pg (ref 26.0–34.0)
MCHC: 30.6 g/dL (ref 30.0–36.0)
MCV: 89.9 fL (ref 78.0–100.0)
MONO ABS: 0.7 10*3/uL (ref 0.1–1.0)
Monocytes Relative: 9 %
Neutro Abs: 5.4 10*3/uL (ref 1.7–7.7)
Neutrophils Relative %: 66 %
PLATELETS: 195 10*3/uL (ref 150–400)
RBC: 4.83 MIL/uL (ref 3.87–5.11)
RDW: 13.2 % (ref 11.5–15.5)
WBC: 8.3 10*3/uL (ref 4.0–10.5)

## 2017-05-12 LAB — COMPREHENSIVE METABOLIC PANEL
ALT: 37 U/L (ref 14–54)
ANION GAP: 13 (ref 5–15)
AST: 25 U/L (ref 15–41)
Albumin: 4 g/dL (ref 3.5–5.0)
Alkaline Phosphatase: 56 U/L (ref 38–126)
BUN: 7 mg/dL (ref 6–20)
CALCIUM: 9.5 mg/dL (ref 8.9–10.3)
CHLORIDE: 96 mmol/L — AB (ref 101–111)
CO2: 24 mmol/L (ref 22–32)
CREATININE: 0.6 mg/dL (ref 0.44–1.00)
GFR calc Af Amer: >60 mL/min (ref >60)
Glucose, Bld: 272 mg/dL — ABNORMAL HIGH (ref 65–99)
Potassium: 4 mmol/L (ref 3.5–5.1)
SODIUM: 133 mmol/L — AB (ref 135–145)
Total Bilirubin: 0.7 mg/dL (ref 0.3–1.2)
Total Protein: 7.8 g/dL (ref 6.5–8.1)

## 2017-05-12 LAB — URINALYSIS, ROUTINE W REFLEX MICROSCOPIC
Bilirubin Urine: NEGATIVE
HGB URINE DIPSTICK: NEGATIVE
Ketones, ur: 5 mg/dL — AB
NITRITE: NEGATIVE
PH: 5 (ref 5.0–8.0)
Protein, ur: 30 mg/dL — AB
Specific Gravity, Urine: 1.036 — ABNORMAL HIGH (ref 1.005–1.030)

## 2017-05-12 LAB — LIPASE, BLOOD: LIPASE: 37 U/L (ref 11–51)

## 2017-05-12 MED ORDER — SODIUM CHLORIDE 0.9 % IV BOLUS
1000.0000 mL | Freq: Once | INTRAVENOUS | Status: AC
Start: 2017-05-12 — End: 2017-05-12
  Administered 2017-05-12: 1000 mL via INTRAVENOUS

## 2017-05-12 MED ORDER — ONDANSETRON HCL 4 MG/2ML IJ SOLN
4.0000 mg | Freq: Once | INTRAMUSCULAR | Status: AC
  Administered 2017-05-12: 4 mg via INTRAVENOUS
  Filled 2017-05-12: qty 2

## 2017-05-12 NOTE — Discharge Instructions (Addendum)
Your glucose is slightly elevated.  Increase fluids.  Tylenol for pain and fever.  Rest.

## 2017-05-12 NOTE — ED Triage Notes (Signed)
Patient complaining of generalized body aches starting yesterday. Denies any other symptoms.

## 2017-05-12 NOTE — ED Provider Notes (Signed)
[facility] EMERGENCY DEPARTMENT Provider Note   CSN: [id number] Arrival date & time: 05/12/17  4098     History   Chief Complaint Chief Complaint  Patient presents with  . Generalized Body Aches    HPI Bonnie Keith is a 57 y.o. female.  Patient complains of generalized body achiness, headache since yesterday.  No stiff neck or neurological deficits.  No chest pain, dyspnea, cough, dysuria.  She is a type II diabetic and has hypertension.  She has taken over-the-counter remedies with no success.  Severity of symptoms is mild.     Past Medical History:  Diagnosis Date  . Diabetes mellitus without complication (HCC)   . Hot flashes 10/24/2013  . Hypertension   . Moody 10/24/2013  . Obesity   . Sleep apnea     Patient Active Problem List   Diagnosis Date Noted  . Uncontrolled type 2 diabetes mellitus with complication, without long-term current use of insulin (HCC) 07/30/2015  . Essential hypertension, benign 07/30/2015  . Morbid obesity due to excess calories (HCC) 07/30/2015  . Personal history of noncompliance with medical treatment, presenting hazards to health 07/30/2015  . Hot flashes 10/24/2013  . Moody 10/24/2013  . 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 Pes anserinus bursitis 05/17/2012    Past Surgical History:  Procedure Laterality Date  . ablasion     cervical  . CARPAL TUNNEL RELEASE    . CHOLECYSTECTOMY    . ENDOMETRIAL ABLATION    . ROTATOR CUFF REPAIR    . TUBAL LIGATION       OB History    Gravida  2   Para  1   Term      Preterm      AB  1   Living  1     SAB  1   TAB      Ectopic      Multiple      Live Births               Home Medications    Prior to Admission medications   Medication Sig Start Date End Date Taking? Authorizing Provider  amLODipine (NORVASC) 10 MG tablet Take 5 mg by mouth daily.    Yes [provider]  cetirizine (ZYRTEC) 10 MG tablet Take 10 mg by mouth daily.   Yes  [provider]  furosemide (LASIX) 20 MG tablet Take 20 mg by mouth daily.   Yes [provider]  gabapentin (NEURONTIN) 100 MG capsule Take 100 mg by mouth at bedtime.   Yes [provider]  insulin aspart (NOVOLOG FLEXPEN) 100 UNIT/ML FlexPen Inject 10-16 Units into the skin 3 (three) times daily with meals. 08/25/16  Yes Nida, Denman George, MD  Insulin Glargine (BASAGLAR KWIKPEN) 100 UNIT/ML SOPN INJECT SUBCUTANEOUSLY 60 UNITS AT BEDTIME 11/03/16  Yes Nida, Denman George, MD  Insulin Pen Needle (B-D ULTRAFINE III SHORT PEN) 31G X 8 MM MISC 1 each by Does not apply route as directed. 07/30/15  Yes Nida, Denman George, MD  losartan (COZAAR) 100 MG tablet Take 100 mg by mouth daily.   Yes [provider]  [facility] LANCETS 33G MISC Twice daily testing 07/30/15  Yes Nida, Denman George, MD  [facility] VERIO test strip USE ONE STRIP TO TEST BLOOD GLUCOSE TWICE DAILY 08/16/16  Yes Nida, Denman George, MD  venlafaxine XR (EFFEXOR-XR) 150 MG 24 hr capsule Take 150 mg by mouth daily. 09/21/16  Yes [provider]  Family History Family History  Problem Relation Age of Onset  . Cancer Mother        lung  . Diabetes Mother   . COPD Mother   . Stroke Father   . Hypertension Sister   . Irritable bowel syndrome Sister   . Hypertension Maternal Grandmother   . Diabetes Maternal Grandmother   . Hypertension Maternal Grandfather   . Diabetes Maternal Grandfather   . Cancer Maternal Grandfather        black lung disease  . Stroke Paternal Grandfather   . Irritable bowel syndrome Sister     Social History Social History   Tobacco Use  . Smoking status: Former Smoker    Packs/day: 0.50    Years: 30.00    Pack years: 15.00    Types: Cigarettes    Last attempt to quit: 01/10/2011    Years since quitting: 6.3  . Smokeless tobacco: Never Used  Substance Use Topics  . Alcohol use: No    Alcohol/week: 0.0 oz  . Drug use: No      Allergies   Codeine and Lisinopril   Review of Systems Review of Systems  All other systems reviewed and are negative.    Physical Exam Updated Vital Signs BP (!) 164/80 (BP Location: Left Arm)   Pulse 78   Temp 98.7 F (37.1 C) (Oral)   Resp 20   Ht  (1.626 m)   Wt 125.6 kg (277 lb)   SpO2 100%   BMI 47.55 kg/m   Physical Exam  Constitutional: She is oriented to person, place, and time.  Overweight, no acute distress.  HENT:  Head: Normocephalic and atraumatic.  Eyes: Conjunctivae are normal.  Neck: Neck supple.  Cardiovascular: Normal rate and regular rhythm.  Pulmonary/Chest: Effort normal and breath sounds normal.  Abdominal: Soft. Bowel sounds are normal.  Musculoskeletal: Normal range of motion.  Neurological: She is alert and oriented to person, place, and time.  Skin: Skin is warm and dry.  Psychiatric: She has a normal mood and affect. Her behavior is normal.  Nursing note and vitals reviewed.    ED Treatments / Results  Labs (all labs ordered are listed, but only abnormal results are displayed) Labs Reviewed  COMPREHENSIVE METABOLIC PANEL - Abnormal; Notable for the following components:      Result Value   Sodium 133 (*)    Chloride 96 (*)    Glucose, Bld 272 (*)    All other components within normal limits  URINALYSIS, ROUTINE W REFLEX MICROSCOPIC - Abnormal; Notable for the following components:   APPearance HAZY (*)    Specific Gravity, Urine 1.036 (*)    Glucose, UA >=500 (*)    Ketones, ur 5 (*)    Protein, ur 30 (*)    Leukocytes, UA SMALL (*)    Bacteria, UA RARE (*)    All other components within normal limits  CBC WITH DIFFERENTIAL/PLATELET  LIPASE, BLOOD    EKG None  Radiology No results found.  Procedures Procedures (including critical care time)  Medications Ordered in ED Medications  sodium chloride 0.9 % bolus 1,000 mL (0 mLs Intravenous Stopped 05/12/17 1405)  ondansetron (ZOFRAN) injection 4 mg (4 mg  Intravenous Given 05/12/17 1256)     Initial Impression / Assessment and Plan / ED Course  I have reviewed the triage vital signs and the nursing notes.  Pertinent labs & imaging results that were available during my care of the patient were reviewed by me  and considered in my medical decision making (see chart for details).     History and physical most consistent with viral syndrome.  She is hyperglycemic, but not in DKA.  She responded well to 1 L of IV fluids and IV Zofran.  Stable for discharge.  Final Clinical Impressions(s) / ED Diagnoses   Final diagnoses:  Viral syndrome  Hyperglycemia    ED Discharge Orders    None       Donnetta Hutching, MD 05/12/17 [phone]

## 2017-05-12 NOTE — ED Notes (Signed)
No orders at this time await doctor to see pt.  

## 2017-06-07 ENCOUNTER — Other Ambulatory Visit: Payer: Self-pay | Admitting: "Endocrinology

## 2017-06-07 DIAGNOSIS — E1165 Type 2 diabetes mellitus with hyperglycemia: Secondary | ICD-10-CM

## 2017-06-07 DIAGNOSIS — E039 Hypothyroidism, unspecified: Secondary | ICD-10-CM

## 2017-06-07 DIAGNOSIS — E785 Hyperlipidemia, unspecified: Secondary | ICD-10-CM

## 2017-06-08 LAB — COMPREHENSIVE METABOLIC PANEL
AG RATIO: 1.6 (calc) (ref 1.0–2.5)
ALKALINE PHOSPHATASE (APISO): 53 U/L (ref 33–130)
ALT: 33 U/L — AB (ref 6–29)
AST: 33 U/L (ref 10–35)
Albumin: 4.2 g/dL (ref 3.6–5.1)
BILIRUBIN TOTAL: 0.4 mg/dL (ref 0.2–1.2)
BUN: 8 mg/dL (ref 7–25)
CALCIUM: 9.6 mg/dL (ref 8.6–10.4)
CO2: 29 mmol/L (ref 20–32)
Chloride: 102 mmol/L (ref 98–110)
Creat: 0.58 mg/dL (ref 0.50–1.05)
Globulin: 2.6 g/dL (calc) (ref 1.9–3.7)
Glucose, Bld: 187 mg/dL — ABNORMAL HIGH (ref 65–99)
Potassium: 4.3 mmol/L (ref 3.5–5.3)
SODIUM: 141 mmol/L (ref 135–146)
Total Protein: 6.8 g/dL (ref 6.1–8.1)

## 2017-06-08 LAB — LIPID PANEL
CHOL/HDL RATIO: 2.8 (calc) (ref <5.0)
Cholesterol: 132 mg/dL (ref <200)
HDL: 48 mg/dL — AB (ref >50)
LDL Cholesterol (Calc): 65 mg/dL (calc)
NON-HDL CHOLESTEROL (CALC): 84 mg/dL (ref <130)
TRIGLYCERIDES: 106 mg/dL (ref <150)

## 2017-06-08 LAB — HEMOGLOBIN A1C
EAG (MMOL/L): 13.9 (calc)
Hgb A1c MFr Bld: 10.4 % of total Hgb — ABNORMAL HIGH (ref <5.7)
Mean Plasma Glucose: 252 (calc)

## 2017-06-08 LAB — TSH: TSH: 1.3 mIU/L (ref 0.40–4.50)

## 2017-06-08 LAB — T4, FREE: FREE T4: 1 ng/dL (ref 0.8–1.8)

## 2017-06-13 ENCOUNTER — Ambulatory Visit (INDEPENDENT_AMBULATORY_CARE_PROVIDER_SITE_OTHER): Payer: 59 | Admitting: "Endocrinology

## 2017-06-13 ENCOUNTER — Encounter: Payer: Self-pay | Admitting: "Endocrinology

## 2017-06-13 VITALS — BP 130/74 | HR 91 | Ht 64.0 in | Wt 264.0 lb

## 2017-06-13 DIAGNOSIS — I10 Essential (primary) hypertension: Secondary | ICD-10-CM | POA: Diagnosis not present

## 2017-06-13 DIAGNOSIS — Z9119 Patient's noncompliance with other medical treatment and regimen: Secondary | ICD-10-CM | POA: Diagnosis not present

## 2017-06-13 DIAGNOSIS — Z91199 Patient's noncompliance with other medical treatment and regimen due to unspecified reason: Secondary | ICD-10-CM

## 2017-06-13 DIAGNOSIS — E1165 Type 2 diabetes mellitus with hyperglycemia: Secondary | ICD-10-CM | POA: Insufficient documentation

## 2017-06-13 MED ORDER — BASAGLAR KWIKPEN 100 UNIT/ML ~~LOC~~ SOPN: PEN_INJECTOR | SUBCUTANEOUS | 2 refills | Status: DC

## 2017-06-13 NOTE — Progress Notes (Signed)
Subjective:    Patient ID: Bonnie Keith, female    DOB: [birth year], PCP Ignatius Specking, MD   Past Medical History:  Diagnosis Date  . Diabetes mellitus without complication (HCC)   . Hot flashes 10/24/2013  . Hypertension   . Moody 10/24/2013  . Obesity   . Sleep apnea    Past Surgical History:  Procedure Laterality Date  . ablasion     cervical  . CARPAL TUNNEL RELEASE    . CHOLECYSTECTOMY    . ENDOMETRIAL ABLATION    . ROTATOR CUFF REPAIR    . TUBAL LIGATION     Social History   Socioeconomic History  . Marital status: Divorced    Spouse name: Not on file  . Number of children: Not on file  . Years of education: Not on file  . Highest education level: Not on file  Occupational History  . Occupation: Therapist, nutritional   Social Needs  . Financial resource strain: Not on file  . Food insecurity:    Worry: Not on file    Inability: Not on file  . Transportation needs:    Medical: Not on file    Non-medical: Not on file  Tobacco Use  . Smoking status: Former Smoker    Packs/day: 0.50    Years: 30.00    Pack years: 15.00    Types: Cigarettes    Last attempt to quit: 01/10/2011    Years since quitting: 6.4  . Smokeless tobacco: Never Used  Substance and Sexual Activity  . Alcohol use: No    Alcohol/week: 0.0 oz  . Drug use: No  . Sexual activity: Never    Birth control/protection: None    Comment: ablation  Lifestyle  . Physical activity:    Days per week: Not on file    Minutes per session: Not on file  . Stress: Not on file  Relationships  . Social connections:    Talks on phone: Not on file    Gets together: Not on file    Attends religious service: Not on file    Active member of club or organization: Not on file    Attends meetings of clubs or organizations: Not on file    Relationship status: Not on file  Other Topics Concern  . Not on file  Social History Narrative  . Not on file   Outpatient Encounter Medications as of 06/13/2017  Medication  Sig  . amLODipine (NORVASC) 10 MG tablet Take 5 mg by mouth daily.   . cetirizine (ZYRTEC) 10 MG tablet Take 10 mg by mouth daily.  . furosemide (LASIX) 20 MG tablet Take 20 mg by mouth daily.  Marland Kitchen gabapentin (NEURONTIN) 100 MG capsule Take 100 mg by mouth at bedtime.  . insulin aspart (NOVOLOG FLEXPEN) 100 UNIT/ML FlexPen Inject 10-16 Units into the skin 3 (three) times daily with meals.  . Insulin Glargine (BASAGLAR KWIKPEN) 100 UNIT/ML SOPN INJECT SUBCUTANEOUSLY 80 UNITS AT BEDTIME  . Insulin Pen Needle (B-D ULTRAFINE III SHORT PEN) 31G X 8 MM MISC 1 each by Does not apply route as directed.  Marland Kitchen losartan (COZAAR) 100 MG tablet Take 100 mg by mouth daily.  Letta Pate DELICA LANCETS 33G MISC Twice daily testing  . ONETOUCH VERIO test strip USE ONE STRIP TO TEST BLOOD GLUCOSE TWICE DAILY  . venlafaxine XR (EFFEXOR-XR) 150 MG 24 hr capsule Take 150 mg by mouth daily.  . [DISCONTINUED] Insulin Glargine (BASAGLAR KWIKPEN) 100 UNIT/ML SOPN INJECT SUBCUTANEOUSLY  60 UNITS AT BEDTIME   No facility-administered encounter medications on file as of 06/13/2017.    ALLERGIES: Allergies  Allergen Reactions  . Codeine Itching  . Lisinopril Cough   VACCINATION STATUS: Immunization History  Administered Date(s) Administered  . Influenza,inj,Quad PF,6+ Mos 10/10/2014  . Influenza-Unspecified 10/09/2013    Diabetes  She presents for her follow-up diabetic visit. She has type 2 diabetes mellitus. Onset time: She was diagnosed at approximate age of 47 years. Her disease course has been worsening (Patient is noncompliant , nonadherent, no showed since August 2018 , returns with a log which has incomplete blood glucose readings, no  meter. Her  recent A1c is still high at 10.4%.). There are no hypoglycemic associated symptoms. Pertinent negatives for hypoglycemia include no confusion, headaches, pallor or seizures. Associated symptoms include fatigue, foot paresthesias, polydipsia, polyuria and visual change.  Pertinent negatives for diabetes include no chest pain and no polyphagia. ( ) There are no hypoglycemic complications. Symptoms are worsening. Diabetic complications include peripheral neuropathy. Risk factors for coronary artery disease include diabetes mellitus, dyslipidemia, family history, hypertension, obesity, sedentary lifestyle and tobacco exposure. Current diabetic treatment includes oral agent (monotherapy). She is compliant with treatment none of the time. Her weight is fluctuating minimally. She is following a generally unhealthy diet. When asked about meal planning, she reported none. She has not had a previous visit with a dietitian. Her home blood glucose trend is decreasing steadily. Her breakfast blood glucose range is generally >200 mg/dl. Her lunch blood glucose range is generally >200 mg/dl. Her dinner blood glucose range is generally >200 mg/dl. Her bedtime blood glucose range is generally >200 mg/dl. Her overall blood glucose range is >200 mg/dl. An ACE inhibitor/angiotensin II receptor blocker is being taken. Eye exam is not current.  Hypertension  This is a chronic problem. The current episode started more than 1 year ago. The problem is controlled. Pertinent negatives include no chest pain, headaches, palpitations or shortness of breath. Risk factors for coronary artery disease include diabetes mellitus, family history, obesity, sedentary lifestyle and smoking/tobacco exposure. Past treatments include angiotensin blockers and calcium channel blockers.    Review of Systems  Constitutional: Positive for fatigue. Negative for chills, fever and unexpected weight change.  HENT: Negative for trouble swallowing and voice change.   Eyes: Negative for visual disturbance.  Respiratory: Negative for cough, shortness of breath and wheezing.   Cardiovascular: Negative for chest pain, palpitations and leg swelling.  Gastrointestinal: Negative for diarrhea, nausea and vomiting.  Endocrine:  Positive for polydipsia and polyuria. Negative for cold intolerance, heat intolerance and polyphagia.  Musculoskeletal: Negative for arthralgias and myalgias.  Skin: Negative for color change, pallor, rash and wound.  Neurological: Positive for numbness. Negative for seizures and headaches.  Psychiatric/Behavioral: Negative for confusion and suicidal ideas.    Objective:    BP 130/74   Pulse 91   Ht 5\' 4"  (1.626 m)   Wt 264 lb (119.7 kg)   BMI 45.32 kg/m   Wt Readings from Last 3 Encounters:  06/13/17 264 lb (119.7 kg)  05/12/17 277 lb (125.6 kg)  11/23/16 263 lb (119.3 kg)    Physical Exam  Constitutional: She is oriented to person, place, and time. She appears well-developed.  Obese.  HENT:  Head: Normocephalic and atraumatic.  Eyes: EOM are normal.  Neck: Normal range of motion. Neck supple. No tracheal deviation present. No thyromegaly present.  Cardiovascular: Normal rate.  Pulmonary/Chest: Effort normal.  Abdominal: Soft. There is no tenderness. There  is no guarding.  Musculoskeletal: Normal range of motion. She exhibits no edema.  Neurological: She is alert and oriented to person, place, and time. She has normal reflexes. No cranial nerve deficit. Coordination normal.  Skin: Skin is warm and dry. No rash noted. No erythema. No pallor.  Psychiatric:  Reluctant and noncompliant affect.   CMP Latest Ref Rng & Units 06/07/2017 05/12/2017 10/11/2016  Glucose 65 - 99 mg/dL 147(W187(H) 295(A272(H) 213(Y223(H)  BUN 7 - 25 mg/dL 8 7 9   Creatinine 0.50 - 1.05 mg/dL 8.650.58 7.840.60 6.960.59  Sodium 135 - 146 mmol/L 141 133(L) 135  Potassium 3.5 - 5.3 mmol/L 4.3 4.0 3.9  Chloride 98 - 110 mmol/L 102 96(L) 98(L)  CO2 20 - 32 mmol/L 29 24 29   Calcium 8.6 - 10.4 mg/dL 9.6 9.5 9.0  Total Protein 6.1 - 8.1 g/dL 6.8 7.8 7.3  Total Bilirubin 0.2 - 1.2 mg/dL 0.4 0.7 0.3  Alkaline Phos 38 - 126 U/L - 56 59  AST 10 - 35 U/L 33 25 33  ALT 6 - 29 U/L 33(H) 37 31    Diabetic Labs (most recent): Lab Results   Component Value Date   HGBA1C 10.4 (H) 06/07/2017   HGBA1C 10.4 (H) 07/14/2016   HGBA1C 9.4 (H) 09/29/2015     Lipid Panel ( most recent) Lipid Panel     Component Value Date/Time   CHOL 132 06/07/2017 1139   TRIG 106 06/07/2017 1139   HDL 48 (L) 06/07/2017 1139   CHOLHDL 2.8 06/07/2017 1139   VLDL 22 07/14/2016 1547   LDLCALC 65 06/07/2017 1139     Assessment & Plan:   1. Uncontrolled type 2 diabetes mellitus with complication, without long-term current use of insulin (HCC) - She came with persistently hyperglycemia above 200 mg/dl. - Her diabetes is  complicated by Noncompliance/ Nonadherence, obesity, peripheral neuropathy and patient remains at a high risk for more acute and chronic complications of diabetes which include CAD, CVA, CKD, retinopathy, and neuropathy. These are all discussed in detail with the patient.  - She was supposed to return 2 weeks after her last visit in August 2018.  No showed since then, returns with A1c is still high at 10.4%.       Recent labs reviewed, showing normal renal function.   - I have re-counseled the patient on diet management and weight loss  by adopting a carbohydrate restricted / protein rich  Diet.  -  Suggestion is made for her to avoid simple carbohydrates  from her diet including Cakes, Sweet Desserts / Pastries, Ice Cream, Soda (diet and regular), Sweet Tea, Candies, Chips, Cookies, Store Bought Juices, Alcohol in Excess of  1-2 drinks a day, Artificial Sweeteners, and "Sugar-free" Products. This will help patient to have stable blood glucose profile and potentially avoid unintended weight gain.   - Patient is advised to stick to a routine mealtimes to eat 3 meals  a day and avoid unnecessary snacks ( to snack only to correct hypoglycemia).  - I have approached patient with the following individualized plan to manage diabetes and patient agrees.  - Patient has history of alarming noncompliance, cannot commit to monitoring of  blood glucose for safe use of insulin.  -  Based on her current glycemic burden, she will require basal/bolus insulin to treat diabetes to target.  -She accepts to increase her Basaglar to 80  units daily at bedtime,   continue NovoLog 10-16 units 3 times a day before meals for pre-meal blood  glucose above 90 mg/dL,   associated with strict monitoring of blood glucose 4 times a day-before meals and at bedtime and return in 2 weeks with her meter and logs for reevaluation.  -Patient is encouraged to call clinic for blood glucose levels less than 70 or above 300 mg /dl.  - Patient will be considered for incretin therapy as appropriate next visit. - Patient specific target  for A1c; LDL, HDL, Triglycerides, and  Waist Circumference were discussed in detail.  2) BP/HTN: Her blood pressure is controlled to target.  She is advised to continue her current blood pressure medications including losartan 100 mg p.o. daily, amlodipine 5 mg p.o. daily.   3) Lipids/HPL: Her recent fasting lipid panel showed controlled LDL at  64. she is not on statins. 4)  Weight/Diet:  She missed her appointment with the dietitian as well.  exercise, and carbohydrates information provided.  5) Chronic Care/Health Maintenance:  -Patient is on ARB and encouraged to continue to follow up with Ophthalmology, Podiatrist at least yearly or according to recommendations, and advised to  stay away from smoking. I have recommended yearly flu vaccine and pneumonia vaccination at least every 5 years; moderate intensity exercise for up to 150 minutes weekly; and  sleep for at least 7 hours a day.  - I advised patient to maintain close follow up with Ignatius Specking, MD for primary care needs.  - Time spent with the patient: 25 min, of which >50% was spent in reviewing her blood glucose logs , discussing her hypo- and hyper-glycemic episodes, reviewing her current and  previous labs and insulin doses and developing a plan to avoid hypo- and  hyper-glycemia. Please refer to Patient Instructions for Blood Glucose Monitoring and Insulin/Medications Dosing Guide"  in media tab for additional information. Bonnie Keith participated in the discussions, expressed understanding, and voiced agreement with the above plans.  All questions were answered to her satisfaction. she is encouraged to contact clinic should she have any questions or concerns prior to her return visit.  Follow up plan: -Return in about 3 months (around 09/13/2017) for meter, and logs.  Marquis Lunch, MD Phone: [phone]  Fax: [phone]  -  This note was partially dictated with voice recognition software. Similar sounding words can be transcribed inadequately or may not  be corrected upon review.  06/13/2017, 12:59 PM

## 2017-06-13 NOTE — Patient Instructions (Signed)

## 2017-07-21 ENCOUNTER — Other Ambulatory Visit: Payer: Self-pay | Admitting: "Endocrinology

## 2017-08-06 ENCOUNTER — Other Ambulatory Visit: Payer: Self-pay | Admitting: "Endocrinology

## 2017-08-25 ENCOUNTER — Other Ambulatory Visit: Payer: Self-pay | Admitting: "Endocrinology

## 2017-09-19 ENCOUNTER — Ambulatory Visit: Payer: 59 | Admitting: "Endocrinology

## 2017-10-11 ENCOUNTER — Ambulatory Visit: Payer: 59 | Admitting: "Endocrinology

## 2017-12-25 ENCOUNTER — Other Ambulatory Visit: Payer: Self-pay | Admitting: Adult Health

## 2017-12-25 DIAGNOSIS — Z1231 Encounter for screening mammogram for malignant neoplasm of breast: Secondary | ICD-10-CM

## 2018-10-24 ENCOUNTER — Other Ambulatory Visit: Payer: 59 | Admitting: Adult Health

## 2019-02-01 IMAGING — DX DG CHEST 2V
2 series · 2 of 2 positions shown · non-contrast
Comparison: None.

CLINICAL DATA: Chest pain and pressure, 2 weeks duration. Left arm
pain.

EXAM:
CHEST  2 VIEW

[chest pa]
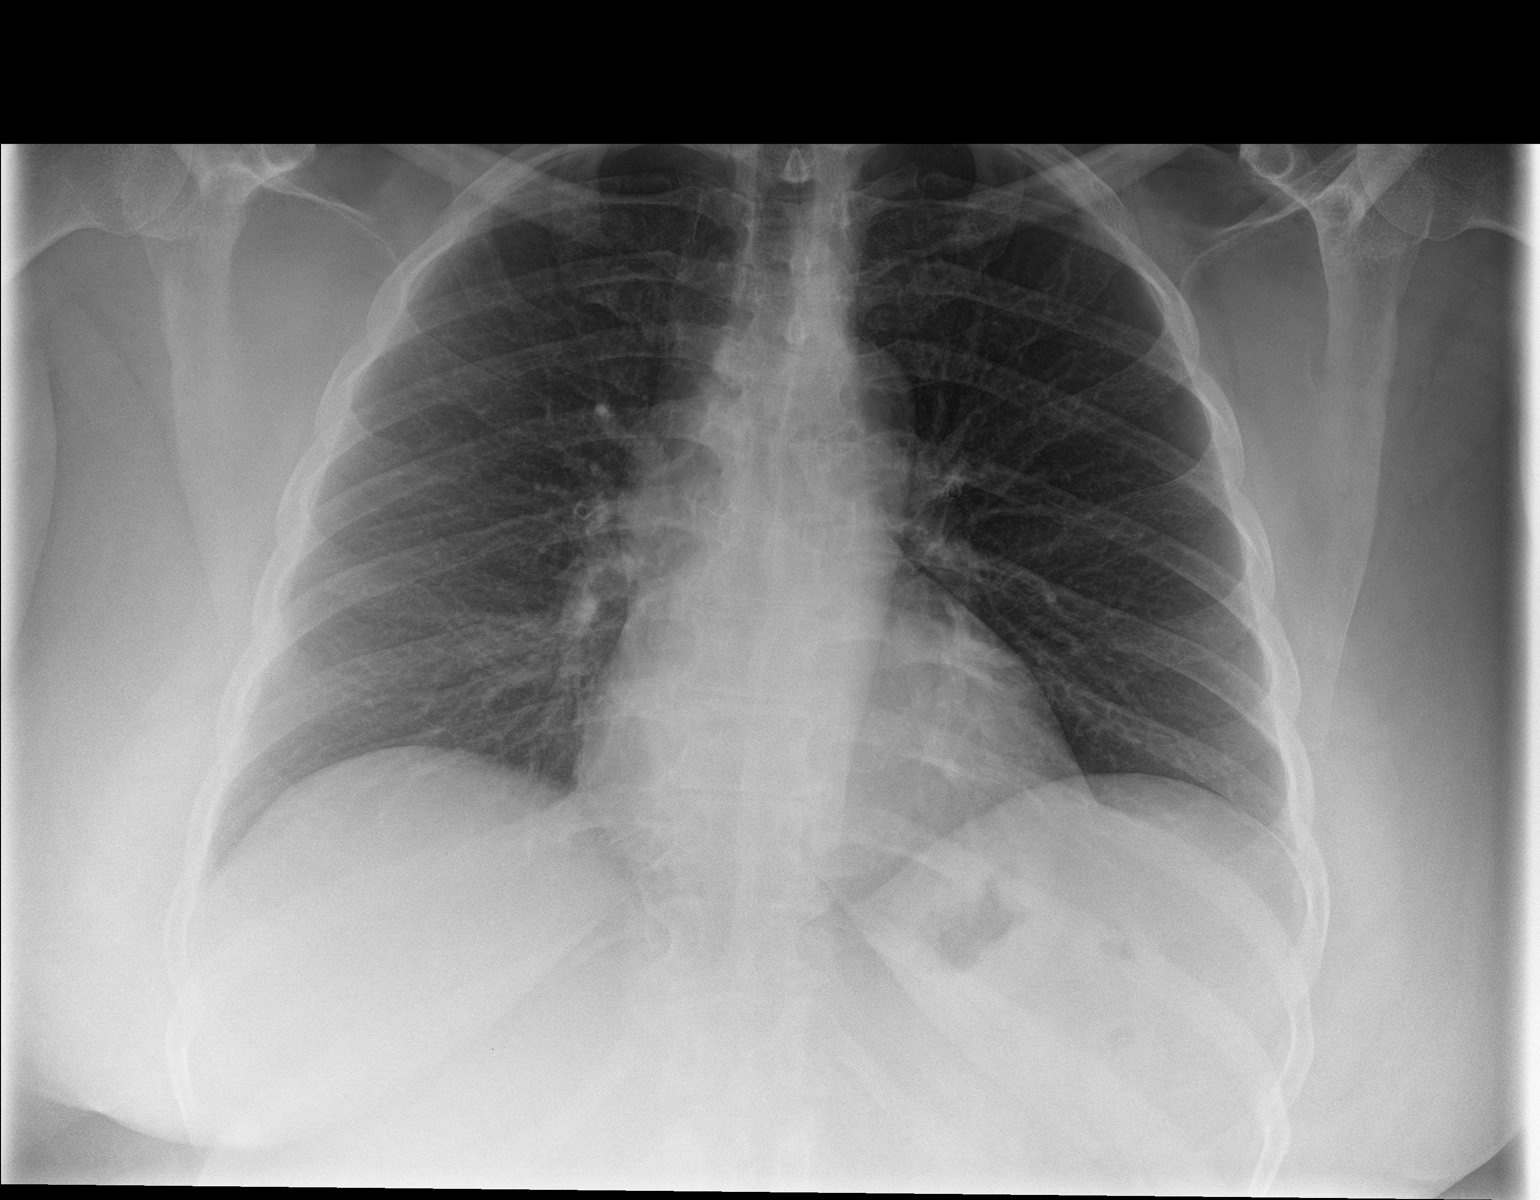

[chest lat]
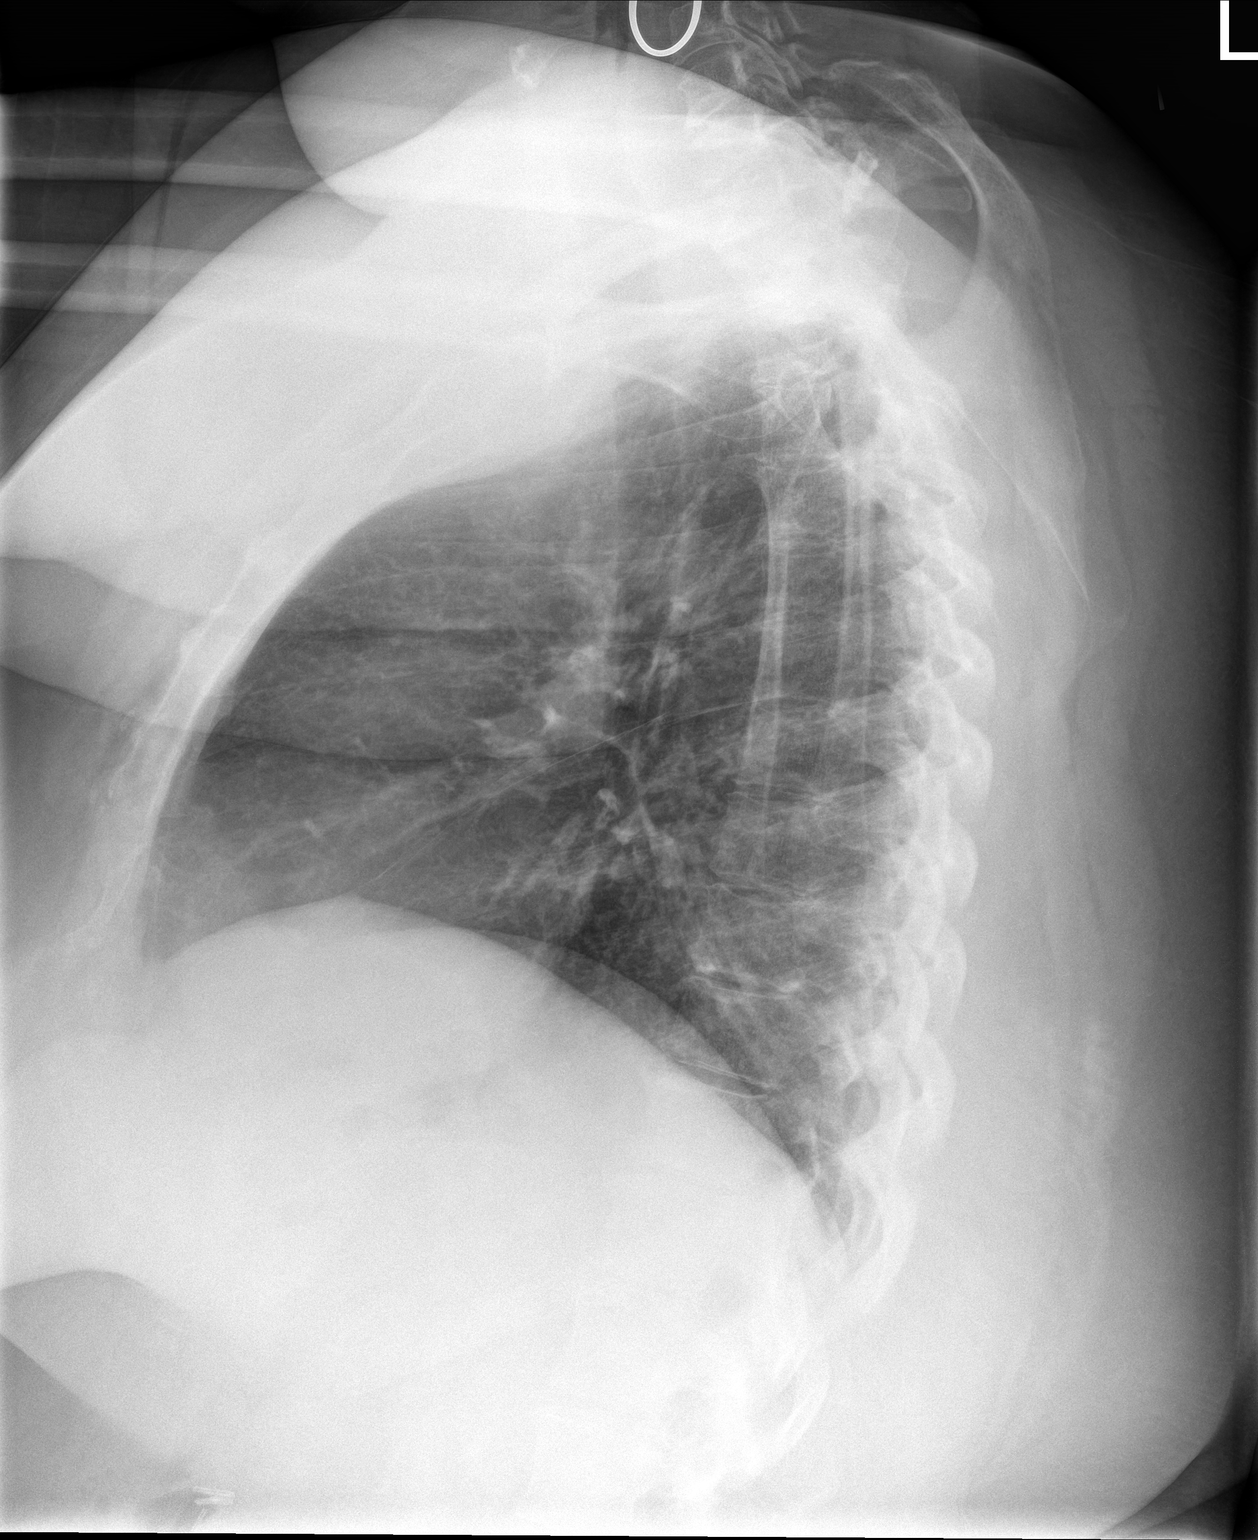

[2 of 2 positions shown; findings below may reference images not displayed]

FINDINGS: Heart size is normal. Mediastinal shadows are normal. The lungs are
clear. No bronchial thickening. No infiltrate, mass, effusion or
collapse. Pulmonary vascularity is normal. No bony abnormality.
IMPRESSION: Normal chest

## 2020-08-27 ENCOUNTER — Emergency Department: Payer: Worker's Compensation

## 2020-08-27 ENCOUNTER — Emergency Department
Admission: EM | Admit: 2020-08-27 | Discharge: 2020-08-27 | Disposition: A | Discharge status: Home / Self Care | Payer: Worker's Compensation | Attending: Emergency Medicine | Admitting: Emergency Medicine

## 2020-08-27 ENCOUNTER — Other Ambulatory Visit: Payer: Self-pay

## 2020-08-27 ENCOUNTER — Encounter: Payer: Self-pay | Admitting: Emergency Medicine

## 2020-08-27 DIAGNOSIS — I10 Essential (primary) hypertension: Secondary | ICD-10-CM | POA: Insufficient documentation

## 2020-08-27 DIAGNOSIS — S01511A Laceration without foreign body of lip, initial encounter: Secondary | ICD-10-CM | POA: Insufficient documentation

## 2020-08-27 DIAGNOSIS — Z79899 Other long term (current) drug therapy: Secondary | ICD-10-CM | POA: Diagnosis not present

## 2020-08-27 DIAGNOSIS — S8002XA Contusion of left knee, initial encounter: Secondary | ICD-10-CM | POA: Diagnosis not present

## 2020-08-27 DIAGNOSIS — S0993XA Unspecified injury of face, initial encounter: Secondary | ICD-10-CM | POA: Diagnosis present

## 2020-08-27 DIAGNOSIS — W01198A Fall on same level from slipping, tripping and stumbling with subsequent striking against other object, initial encounter: Secondary | ICD-10-CM | POA: Diagnosis not present

## 2020-08-27 DIAGNOSIS — Z794 Long term (current) use of insulin: Secondary | ICD-10-CM | POA: Insufficient documentation

## 2020-08-27 DIAGNOSIS — Z87891 Personal history of nicotine dependence: Secondary | ICD-10-CM | POA: Diagnosis not present

## 2020-08-27 DIAGNOSIS — W19XXXA Unspecified fall, initial encounter: Secondary | ICD-10-CM

## 2020-08-27 DIAGNOSIS — E1165 Type 2 diabetes mellitus with hyperglycemia: Secondary | ICD-10-CM | POA: Diagnosis not present

## 2020-08-27 MED ORDER — LIDOCAINE HCL (PF) 1 % IJ SOLN
5.0000 mL | Freq: Once | INTRAMUSCULAR | Status: AC
  Administered 2020-08-27: 5 mL

## 2020-08-27 MED ORDER — LIDOCAINE HCL (PF) 1 % IJ SOLN
5.0000 mL | Freq: Once | INTRAMUSCULAR | Status: AC
  Administered 2020-08-27: 5 mL
  Filled 2020-08-27: qty 5

## 2020-08-27 MED ORDER — NAPROXEN SODIUM 550 MG PO TABS: 550.0000 mg | ORAL_TABLET | Freq: Two times a day (BID) | ORAL | 0 refills | Status: AC

## 2020-08-27 MED ORDER — BACITRACIN-NEOMYCIN-POLYMYXIN 400-5-5000 EX OINT
TOPICAL_OINTMENT | Freq: Once | CUTANEOUS | Status: AC
  Filled 2020-08-27: qty 1

## 2020-08-27 NOTE — ED Notes (Signed)
See triage note  Presents s/p fall  States she tripped   Laceration noted to chin  Also states she hit her head and hip/knee

## 2020-08-27 NOTE — ED Provider Notes (Signed)
[facility] Emergency Department Provider Note ____________________________________________  Time seen: 1225  I have reviewed the triage vital signs and the nursing notes.  HISTORY  Chief Complaint  Fall and Laceration   HPI Bonnie Keith is a 60 y.o. female with the below medical history, presents to the ED for evaluation of injury sustained following a mechanical fall.  Patient presents to the ED complains that she tripped and fell, hitting her face.  She presents with a small laceration to the underside of her lower lip at the chin.  She denies any loss of consciousness, dizziness, chest pain, or shortness of breath.  Patient denies any current blood thinner use.  She also reports some acute left knee pain related to the fall.  Past Medical History:  Diagnosis Date   Diabetes mellitus without complication (HCC)    Hot flashes 10/24/2013   Hypertension    Moody 10/24/2013   Obesity    Sleep apnea     Patient Active Problem List   Diagnosis Date Noted   Uncontrolled type 2 diabetes mellitus with hyperglycemia (HCC) 06/13/2017   Uncontrolled type 2 diabetes mellitus with complication, without long-term current use of insulin (HCC) 07/30/2015   Essential hypertension, benign 07/30/2015   Morbid obesity due to excess calories (HCC) 07/30/2015   Personal history of noncompliance with medical treatment, presenting hazards to health 07/30/2015   Hot flashes 10/24/2013   Moody 10/24/2013   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 Authorizing Provider  naproxen sodium (ANAPROX) 550 MG tablet Take 1 tablet (550 mg total) by mouth 2 (two) times daily  with a meal. 08/27/20  Yes Lillianah Swartzentruber, Charlesetta Ivory, PA-C  amLODipine (NORVASC) 10 MG tablet Take 5 mg by mouth daily.     [provider]  cetirizine (ZYRTEC) 10 MG tablet Take 10 mg by mouth daily.    [provider]  furosemide (LASIX) 20 MG tablet Take 20 mg by mouth daily.    [provider]  gabapentin (NEURONTIN) 100 MG capsule Take 100 mg by mouth at bedtime.    [provider]  insulin aspart (NOVOLOG FLEXPEN) 100 UNIT/ML FlexPen Inject 10-16 Units into the skin 3 (three) times daily with meals. 08/25/16   Roma Kayser, MD  Insulin Glargine (BASAGLAR KWIKPEN) 100 UNIT/ML SOPN INJECT SUBCUTANEOUSLY 80 UNITS AT BEDTIME 08/13/17   Nida, Denman George, MD  Insulin Pen Needle (B-D ULTRAFINE III SHORT PEN) 31G X 8 MM MISC 1 each by Does not apply route as directed. 07/30/15   Roma Kayser, MD  losartan (COZAAR) 100 MG tablet Take 100 mg by mouth daily.    [provider]  Dola Argyle LANCETS 33G MISC Twice daily testing 07/30/15   Roma Kayser, MD  [facility] VERIO test strip USE 1 TEST STRIP TO TEST BLOOD GLUCOSE SUGAR TWICE DAILY 08/27/17   Roma Kayser, MD  venlafaxine XR (EFFEXOR-XR) 150 MG 24 hr capsule Take 150 mg by mouth daily. 09/21/16   [provider]    Allergies Codeine and Lisinopril  Family History  Problem Relation Age  of Onset   Cancer Mother        lung   Diabetes Mother    COPD Mother    Stroke Father    Hypertension Sister    Irritable bowel syndrome Sister    Hypertension Maternal Grandmother    Diabetes Maternal Grandmother    Hypertension Maternal Grandfather    Diabetes Maternal Grandfather    Cancer Maternal Grandfather        black lung disease   Stroke Paternal Grandfather    Irritable bowel syndrome Sister     Social History Social History   Tobacco Use   Smoking status: Former    Packs/day: 0.50    Years: 30.00    Pack years: 15.00    Types: Cigarettes     Quit date: 01/10/2011    Years since quitting: 9.6   Smokeless tobacco: Never  Vaping Use   Vaping Use: Never used  Substance Use Topics   Alcohol use: No    Alcohol/week: 0.0 standard drinks   Drug use: No    Review of Systems  Constitutional: Negative for fever. Eyes: Negative for visual changes. ENT: Negative for sore throat. Cardiovascular: Negative for chest pain. Respiratory: Negative for shortness of breath. Gastrointestinal: Negative for abdominal pain, vomiting and diarrhea. Genitourinary: Negative for dysuria. Musculoskeletal: Negative for back pain. Left knee pain as above Skin: Negative for rash. Facial laceration Neurological: Negative for headaches, focal weakness or numbness. ____________________________________________  PHYSICAL EXAM:  VITAL SIGNS: ED Triage Vitals  Enc Vitals Group     BP 08/27/20 1129 (!) 159/79     Pulse Rate 08/27/20 1129 90     Resp 08/27/20 1129 18     Temp 08/27/20 1129 98.8 F (37.1 C)     Temp Source 08/27/20 1129 Oral     SpO2 08/27/20 1129 98 %     Weight 08/27/20 1127 263 lb 14.3 oz (119.7 kg)     Height 08/27/20 1127 5\' 4"  (1.626 m)     Head Circumference --      Peak Flow --      Pain Score 08/27/20 1127 7     Pain Loc --      Pain Edu? --      Excl. in GC? --     Constitutional: Alert and oriented. Well appearing and in no distress. Head: Normocephalic and atraumatic. Eyes: Conjunctivae are normal. PERRL. Normal extraocular movements Nose: No congestion/rhinorrhea/epistaxis. Mouth/Throat: Mucous membranes are moist.  Lower lip with a linear laceration at follows the vermilion border of the need lip.  No dental injuries appreciated. Neck: Supple. No thyromegaly. Cardiovascular: Normal rate, regular rhythm. Normal distal pulses. Respiratory: Normal respiratory effort. No wheezes/rales/rhonchi. Gastrointestinal: Soft and nontender. No distention. Musculoskeletal: Nontender with normal range of motion in all  extremities.  Neurologic:  Normal gait without ataxia. Normal speech and language. No gross focal neurologic deficits are appreciated. Skin:  Skin is warm, dry and intact. No rash noted. Psychiatric: Mood and affect are normal. Patient exhibits appropriate insight and judgment. ____________________________________________    {LABS (pertinent positives/negatives)  ____________________________________________  {EKG  ____________________________________________   RADIOLOGY Official radiology report(s): DG Knee 2 Views Left  Result Date: 08/27/2020 CLINICAL DATA:  Knee pain status post fall EXAM: LEFT KNEE - 1-2 VIEW COMPARISON:  None. FINDINGS: No acute fracture or dislocation. No aggressive osseous lesion. Normal alignment. Generalized osteopenia. Bony prominence along the peripheral aspect of the medial femoral condyle at the [facility] origin likely reflecting sequela prior MCL injury.  Mild medial femorotibial compartment and patellofemoral compartment joint space narrowing. No joint effusion. Soft tissue are unremarkable. No radiopaque foreign body or soft tissue emphysema. IMPRESSION: No acute osseous injury of the left knee. Electronically Signed   By: Elige Ko M.D.   On: 08/27/2020 12:13   ____________________________________________  PROCEDURES  .Marland KitchenLaceration Repair  Date/Time: 08/27/2020 2:20 PM Performed by: Lissa Hoard, PA-C Authorized by: Lissa Hoard, PA-C   Consent:    Consent obtained:  Verbal   Consent given by:  Patient   Risks, benefits, and alternatives were discussed: yes     Risks discussed:  Pain and poor cosmetic result Universal protocol:    Site/side marked: yes     Patient identity confirmed:  Verbally with patient Anesthesia:    Anesthesia method:  Local infiltration   Local anesthetic:  Lidocaine 1% w/o epi Laceration details:    Location:  Lip   Lip location:  Lower exterior lip   Length (cm):  2   Depth (mm):  4 Pre-procedure  details:    Preparation:  Patient was prepped and draped in usual sterile fashion Exploration:    Contaminated: no   Treatment:    Area cleansed with:  Saline   Amount of cleaning:  Standard   Irrigation solution:  Sterile saline   Debridement:  None   Undermining:  None   Scar revision: no   Skin repair:    Repair method:  Sutures   Suture size:  5-0   Suture material:  Nylon   Suture technique:  Simple interrupted   Number of sutures:  4 Approximation:    Approximation:  Close   Vermilion border well-aligned: yes   Repair type:    Repair type:  Simple Post-procedure details:    Dressing:  Antibiotic ointment   Procedure completion:  Tolerated well, no immediate complications ____________________________________________   INITIAL IMPRESSION / ASSESSMENT AND PLAN / ED COURSE  As part of my medical decision making, I reviewed the following data within the electronic MEDICAL RECORD NUMBER Radiograph reviewed NAD and Notes from prior ED visits      DDX: lip laceration, dental injury, patella fracture, knee sprain    Patient ED evaluation of injury sustained following mechanical fall.  She presents with complaints of a lower lip laceration, and left knee pain.  Patient was evaluated for her injuries and found to have a reassuring x-ray of the left knee.  No acute fracture or dislocation.  The lower lip is repaired using sutures with good wound edge approximation.  Patient is discharged with wound care directions and supplies.  She will see her primary provider for suture removal in 7 days.  Return precautions have been reviewed.  Bonnie Keith was evaluated in Emergency Department on 08/27/2020 for the symptoms described in the history of present illness. She was evaluated in the context of the global COVID-19 pandemic, which necessitated consideration that the patient might be at risk for infection with the SARS-CoV-2 virus that causes COVID-19. Institutional protocols and algorithms  that pertain to the evaluation of patients at risk for COVID-19 are in a state of rapid change based on information released by regulatory bodies including the CDC and federal and state organizations. These policies and algorithms were followed during the patient's care in the ED. ____________________________________________  FINAL CLINICAL IMPRESSION(S) / ED DIAGNOSES  Final diagnoses:  Fall, initial encounter  Lip laceration, initial encounter  Contusion of left knee, initial encounter  Lissa HoardMenshew, Jann Ra V Bacon, PA-C 08/27/20 1443    Jene EveryKinner, Robert, MD 08/27/20 1454

## 2020-08-27 NOTE — Discharge Instructions (Addendum)
Keep the wounds clean, dry, and covered.  See your primary provider in 7 days for suture removal.  Take the naproxen as prescribed for joint pain.  Return to the ED if needed.

## 2020-08-27 NOTE — ED Triage Notes (Signed)
Pt comes into the ED via POV c/o mechanical fall where she tripped and fell and hit hip face.  Pt has laceration to the bottom of her lip/chin.  All bleeding under control at this time.  Denies any LOC or blood thinners.

## 2022-09-14 DIAGNOSIS — Z299 Encounter for prophylactic measures, unspecified: Secondary | ICD-10-CM | POA: Diagnosis not present

## 2022-09-14 DIAGNOSIS — E1165 Type 2 diabetes mellitus with hyperglycemia: Secondary | ICD-10-CM | POA: Diagnosis not present

## 2022-09-14 DIAGNOSIS — I10 Essential (primary) hypertension: Secondary | ICD-10-CM | POA: Diagnosis not present

## 2022-09-14 DIAGNOSIS — M545 Low back pain, unspecified: Secondary | ICD-10-CM | POA: Diagnosis not present

## 2023-03-31 ENCOUNTER — Other Ambulatory Visit: Payer: Self-pay

## 2023-03-31 ENCOUNTER — Encounter (HOSPITAL_COMMUNITY): Payer: Self-pay

## 2023-03-31 ENCOUNTER — Emergency Department (HOSPITAL_COMMUNITY): Admission: EM | Admit: 2023-03-31 | Discharge: 2023-03-31 | Disposition: A | Discharge status: Home / Self Care

## 2023-03-31 DIAGNOSIS — J020 Streptococcal pharyngitis: Secondary | ICD-10-CM | POA: Insufficient documentation

## 2023-03-31 DIAGNOSIS — R059 Cough, unspecified: Secondary | ICD-10-CM | POA: Diagnosis present

## 2023-03-31 DIAGNOSIS — Z794 Long term (current) use of insulin: Secondary | ICD-10-CM | POA: Diagnosis not present

## 2023-03-31 DIAGNOSIS — E119 Type 2 diabetes mellitus without complications: Secondary | ICD-10-CM | POA: Diagnosis not present

## 2023-03-31 LAB — RESP PANEL BY RT-PCR (RSV, FLU A&B, COVID)  RVPGX2
Influenza A by PCR: NEGATIVE
Influenza B by PCR: NEGATIVE
Resp Syncytial Virus by PCR: NEGATIVE
SARS Coronavirus 2 by RT PCR: NEGATIVE

## 2023-03-31 LAB — GROUP A STREP BY PCR: Group A Strep by PCR: DETECTED — AB

## 2023-03-31 MED ORDER — IBUPROFEN 400 MG PO TABS
600.0000 mg | ORAL_TABLET | Freq: Once | ORAL | Status: AC
  Administered 2023-03-31: 600 mg via ORAL
  Filled 2023-03-31: qty 2

## 2023-03-31 MED ORDER — AMOXICILLIN 250 MG PO CAPS
1000.0000 mg | ORAL_CAPSULE | Freq: Once | ORAL | Status: AC
  Administered 2023-03-31: 1000 mg via ORAL
  Filled 2023-03-31: qty 4

## 2023-03-31 MED ORDER — AMOXICILLIN 500 MG PO CAPS: 1000.0000 mg | ORAL_CAPSULE | Freq: Every day | ORAL | 0 refills | Status: AC

## 2023-03-31 NOTE — Discharge Instructions (Signed)
 Seen in the ER today for sore throat and ear pain.  You do have strep throat.  We are treating with antibiotics.  Make sure you do not share drinks or utensils with others, wash your hands frequently.  You are contagious until you have been on antibiotics for at least 24 hours.  Make sure you change your toothbrush in the next couple of days as this can harbor bacteria.  Drink plenty of fluids, you can take over-the-counter Tylenol and ibuprofen as directed on the packaging as needed for discomfort.  Come back to the ER if you have new or worsening symptoms.

## 2023-03-31 NOTE — ED Triage Notes (Signed)
 Pt c/o bilateral ear pain and throat pain starting two days ago. Pt also states a productive cough clear in color though. Pt denies fever, n/v, and diarrhea. 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

## 2023-03-31 NOTE — ED Provider Notes (Signed)
 [place] EMERGENCY DEPARTMENT AT [facility] Provider Note   CSN: [id number] Arrival date & time: 03/31/23  6962     History  Chief Complaint  Patient presents with   Otalgia   Sore Throat    Bonnie Keith is a 63 y.o. female.  She has PMH of type 2 diabetes, obesity.  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  She has been around children who have been sick.  No fever or chills, no trouble swallowing or breathing.   Otalgia Sore Throat       Home Medications Prior to Admission medications   Medication Sig Start Date End Date Taking? Authorizing Provider  amoxicillin (AMOXIL) 500 MG capsule Take 2 capsules (1,000 mg total) by mouth daily at 12 noon for 9 days. 04/01/23 04/10/23 Yes Janell Keeling A, PA-C  amLODipine (NORVASC) 10 MG tablet Take 5 mg by mouth daily.     [provider]  cetirizine (ZYRTEC) 10 MG tablet Take 10 mg by mouth daily.    [provider]  furosemide (LASIX) 20 MG tablet Take 20 mg by mouth daily.    [provider]  gabapentin (NEURONTIN) 100 MG capsule Take 100 mg by mouth at bedtime.    [provider]  insulin aspart (NOVOLOG FLEXPEN) 100 UNIT/ML FlexPen Inject 10-16 Units into the skin 3 (three) times daily with meals. 08/25/16   Roma Kayser, MD  Insulin Glargine (BASAGLAR KWIKPEN) 100 UNIT/ML SOPN INJECT SUBCUTANEOUSLY 80 UNITS AT BEDTIME 08/13/17   Nida, Denman George, MD  Insulin Pen Needle (B-D ULTRAFINE III SHORT PEN) 31G X 8 MM MISC 1 each by Does not apply route as directed. 07/30/15   Roma Kayser, MD  losartan (COZAAR) 100 MG tablet Take 100 mg by mouth daily.    [provider]  naproxen sodium (ANAPROX) 550 MG tablet Take 1 tablet (550 mg total) by mouth 2 (two) times daily with a meal. 08/27/20   Menshew, Charlesetta Ivory, PA-C  ONETOUCH DELICA LANCETS 33G MISC Twice daily testing 07/30/15   Roma Kayser, MD  [facility] VERIO test strip USE 1 TEST STRIP TO TEST BLOOD GLUCOSE SUGAR TWICE DAILY 08/27/17   Roma Kayser, MD  venlafaxine XR (EFFEXOR-XR) 150 MG 24 hr capsule Take 150 mg by mouth daily. 09/21/16   [provider]      Allergies    Codeine and Lisinopril    Review of Systems   Review of Systems  HENT:  Positive for ear pain.     Physical Exam Updated Vital Signs BP 138/75 (BP Location: Right Arm)   Pulse 93   Temp 98.4 F (36.9 C) (Oral)   Resp 16   Ht 5\' 4"  (1.626 m)   Wt 113.4 kg   SpO2 100%   BMI 42.91 kg/m  Physical Exam Vitals and nursing note reviewed.  Constitutional:      General: She is not in acute distress.    Appearance: She is well-developed.  HENT:     Head: Normocephalic and atraumatic.     Right Ear: Tympanic membrane normal.     Left Ear: Tympanic membrane normal.     Mouth/Throat:     Mouth: Mucous membranes are moist.     Pharynx: Uvula midline. Pharyngeal swelling and posterior oropharyngeal erythema present. No uvula swelling.     Tonsils: No tonsillar exudate or tonsillar  abscesses.     Comments: Mild diffuse posterior pharyngeal swelling. No palatal petechia Eyes:     Conjunctiva/sclera: Conjunctivae normal.  Cardiovascular:     Rate and Rhythm: Normal rate and regular rhythm.     Heart sounds: No murmur heard. Pulmonary:     Effort: Pulmonary effort is normal. No respiratory distress.     Breath sounds: Normal breath sounds.  Abdominal:     Palpations: Abdomen is soft.     Tenderness: There is no abdominal tenderness.  Musculoskeletal:        General: No swelling.     Cervical back: Neck supple.  Skin:    General: Skin is warm and dry.     Capillary Refill: Capillary refill takes less than 2 seconds.  Neurological:     General: No focal deficit present.     Mental Status: She is alert and oriented to person, place, and time.  Psychiatric:        Mood and Affect: Mood normal.     ED Results /  Procedures / Treatments   Labs (all labs ordered are listed, but only abnormal results are displayed) Labs Reviewed  GROUP A STREP BY PCR - Abnormal; Notable for the following components:      Result Value   Group A Strep by PCR DETECTED (*)    All other components within normal limits  RESP PANEL BY RT-PCR (RSV, FLU A&B, COVID)  [id number]    EKG None  Radiology No results found.  Procedures Procedures    Medications Ordered in ED Medications  amoxicillin (AMOXIL) capsule 1,000 mg (has no administration in time range)  ibuprofen (ADVIL) tablet 600 mg (has no administration in time range)    ED Course/ Medical Decision Making/ A&P                                 Medical Decision Making This patient presents to the ED for concern of sore throat and ear pan X 2 day, this involves an extensive number of treatment options, and is a complaint that carries with it a high risk of complications and morbidity.  The differential diagnosis includes strep pharyngitis, infectious mononucleosis, viral illness, allergic rhinitis, GERD, otitis media, otitis externa other   Co morbidities that complicate the patient evaluation :   Diabetes   Additional history obtained:  Additional history obtained from EMR External records from outside source obtained and reviewed including prior notes and labs, the patient has not had labs since 2019 in our system   Lab Tests:  I Ordered, and personally interpreted labs.  The pertinent results include: Positive for group A strep, negative for COVID, influenza and RSV     Problem List / ED Course / Critical interventions / Medication management  Sore throat and bilateral ear pain-patient has reassuring exam but does have posterior pharyngeal erythema and swelling, she speaks in full or clear sentences, she is able to handle oral secretions well.  Her group A strep swab was positive.  There is no sign exam that she has a peritonsillar abscess.  She is  not having severe pain or trouble swallowing to suggest retropharyngeal abscess.  Discussed with patient she is positive for strep, will plan to treat with amoxicillin, she can take Tylenol and ibuprofen as needed over-the-counter for discomfort, encouraged to drink plenty of fluids.  She is to follow-up with her PCP and come back if she develops  trouble swallowing or breathing, or any other new or worrisome changes.  I have reviewed the patients home medicines and have made adjustments as needed   Social Determinants of Health:  Patient works in a school       Risk Prescription drug management.           Final Clinical Impression(s) / ED Diagnoses Final diagnoses:  Strep pharyngitis    Rx / DC Orders ED Discharge Orders          Ordered    amoxicillin (AMOXIL) 500 MG capsule  Daily        03/31/23 [address] 03/31/23 1048    Durwin Glaze, MD 03/31/23 [phone]
